# Patient Record
Sex: Female | Born: 1995
Health system: Southern US, Community
[De-identification: ages and names within clinical notes are randomized; demographics above are authoritative.]

## PROBLEM LIST (undated history)

## (undated) DIAGNOSIS — K219 Gastro-esophageal reflux disease without esophagitis: Secondary | ICD-10-CM

## (undated) DIAGNOSIS — F419 Anxiety disorder, unspecified: Secondary | ICD-10-CM

## (undated) DIAGNOSIS — T7840XA Allergy, unspecified, initial encounter: Secondary | ICD-10-CM

## (undated) DIAGNOSIS — F32A Depression, unspecified: Secondary | ICD-10-CM

## (undated) HISTORY — DX: Gastro-esophageal reflux disease without esophagitis: K21.9

## (undated) HISTORY — DX: Allergy, unspecified, initial encounter: T78.40XA

## (undated) HISTORY — DX: Anxiety disorder, unspecified: F41.9

## (undated) HISTORY — DX: Depression, unspecified: F32.A

---
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
## 2017-01-18 HISTORY — PX: WISDOM TOOTH EXTRACTION: SHX21

## 2019-10-27 DIAGNOSIS — R102 Pelvic and perineal pain: Secondary | ICD-10-CM | POA: Diagnosis not present

## 2019-10-27 DIAGNOSIS — Z202 Contact with and (suspected) exposure to infections with a predominantly sexual mode of transmission: Secondary | ICD-10-CM | POA: Diagnosis not present

## 2019-10-30 DIAGNOSIS — Z23 Encounter for immunization: Secondary | ICD-10-CM | POA: Diagnosis not present

## 2019-10-30 DIAGNOSIS — Z124 Encounter for screening for malignant neoplasm of cervix: Secondary | ICD-10-CM | POA: Diagnosis not present

## 2019-10-30 DIAGNOSIS — R102 Pelvic and perineal pain: Secondary | ICD-10-CM | POA: Diagnosis not present

## 2019-10-31 DIAGNOSIS — R102 Pelvic and perineal pain: Secondary | ICD-10-CM | POA: Diagnosis not present

## 2019-10-31 LAB — HM PAP SMEAR: HM Pap smear: NEGATIVE

## 2020-01-19 DIAGNOSIS — J019 Acute sinusitis, unspecified: Secondary | ICD-10-CM | POA: Diagnosis not present

## 2020-01-21 ENCOUNTER — Ambulatory Visit: Payer: Self-pay | Admitting: Physician Assistant

## 2020-03-04 NOTE — Progress Notes (Signed)
New patient visit   Patient: Bethany Malone   DOB: [birth year]   24 y.o. Female  MRN: [id number] Visit Date: 03/05/2020  Today's healthcare provider: Trey Sailors, PA-C   Chief Complaint  Patient presents with  . New Patient (Initial Visit)  I,Bethany Malone M Bethany Malone,acting as a scribe for Trey Sailors, PA-C.,have documented all relevant documentation on the behalf of Trey Sailors, PA-C,as directed by  Trey Sailors, PA-C while in the presence of Trey Sailors, PA-C.  Subjective    Bethany Malone is a 25 y.o. female who presents today as a new patient to establish care.  HPI   Originally from [place], [place]. Living in [place], [place] with mom and step dad. Currently in school at [employer] in [place] for Medical Ultrasound. Just started this. 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   She has a history of anxiety and depression, previously treated with zoloft and Wellbutrin. She is not currently on any medications. She has a history of going to counseling, no longer pursuing this. She does not want to restart medications.   Reports some episodes of palpitations, SOB and chest pain that have been bothering her for several months. It is on the left side of her chest and can be an intermittent stabbing pain. She does not experience this with activity and does not go away with rest. She smoked from 18 to 21, a pack every 3- 4 days. At 21 transitioned to the vaping, after which felt palpitations. Reports a history of running out of breath since being a young child. She does have a history of palpitations that happen twice per week but have been happening more frequently over the past month or so, typically at night. She reports anxiety associated with these episodes. Caffeine use: previously drank [place] energy drinks but does not drink it any longer. She does smoke weed one every two days most commonly a night. Drinks coffee once every three days. Does vape flavored products. She was concerned  at one point that these pains might represent a blood clot or COPD. Patient reports significant anxiety surrounding these symptoms and overall concern that something serious is wrong with her, especially concerning her vaping use.   Abdominal pain.  History reviewed. No pertinent past medical history. History reviewed. No pertinent surgical history. Family Status  Relation Name Status  . Mother  Alive   Family History  Problem Relation Age of Onset  . Anxiety disorder Mother    Social History   Socioeconomic History  . Marital status: Single    Spouse name: Not on file  . Number of children: Not on file  . Years of education: Not on file  . Highest education level: Not on file  Occupational History  . Not on file  Tobacco Use  . Smoking status: Never Smoker  . Smokeless tobacco: Never Used  Vaping Use  . Vaping Use: Every day  Substance and Sexual Activity  . Alcohol use: Yes  . Drug use: Yes    Types: Marijuana  . Sexual activity: Not on file  Other Topics Concern  . Not on file  Social History Narrative  . Not on file   Social Determinants of Health   Financial Resource Strain: Not on file  Food Insecurity: Not on file  Transportation Needs: Not on file  Physical Activity: Not on file  Stress: Not on file  Social Connections: Not on file   No outpatient medications prior to visit.  No facility-administered medications prior to visit.   Allergies  Allergen Reactions  . Fluticasone Other (See Comments)    Headaches    Immunization History  Administered Date(s) Administered  . Influenza,inj,Quad PF,6+ Mos 11/04/2014    Health Maintenance  Topic Date Due  . Hepatitis C Screening  Never done  . HIV Screening  Never done  . INFLUENZA VACCINE  04/17/2020 (Originally 08/19/2019)  . TETANUS/TDAP  03/05/2021 (Originally 12/11/2016)  . PAP-Cervical Cytology Screening  10/31/2022  . PAP SMEAR-Modifier  10/31/2022    Patient Care Team: Maryella Shivers as PCP - General (Physician Assistant)  Review of Systems    Objective    BP 110/77 (BP Location: Left Arm, Patient Position: Sitting, Cuff Size: Normal)   Pulse 83   Temp 98.3 F (36.8 C) (Oral)   Ht 5\' 6"  (1.676 m)   Wt 139 lb 1.6 oz (63.1 kg)   SpO2 100%   BMI 22.45 kg/m  Physical Exam Constitutional:      Appearance: Normal appearance.  HENT:     Right Ear: Tympanic membrane, ear canal and external ear normal.     Left Ear: Tympanic membrane, ear canal and external ear normal.  Cardiovascular:     Rate and Rhythm: Normal rate and regular rhythm.     Pulses: Normal pulses.     Heart sounds: Normal heart sounds.  Pulmonary:     Effort: Pulmonary effort is normal.     Breath sounds: Normal breath sounds.  Abdominal:     General: Abdomen is flat. Bowel sounds are normal.     Palpations: Abdomen is soft.  Skin:    General: Skin is warm and dry.  Neurological:     General: No focal deficit present.     Mental Status: She is alert and oriented to person, place, and time.  Psychiatric:        Mood and Affect: Mood normal.        Behavior: Behavior normal.      Depression Screen No flowsheet data found. Results for orders placed or performed in visit on 03/05/20  HM PAP SMEAR  Result Value Ref Range   HM Pap smear NEGATIVE FOR INTRAEPITHELIAL LESION OR MALIGNANCY     Assessment & Plan     1. Palpitations  No red flags, offered EKG or referral to cardiology, patient declines. Suspect these may be due to untreated anxiety. Offered to get labs, but patient declines.   2. Chest pain, unspecified type  Suspect MSK etiology.   3. Vaping nicotine dependence, non-tobacco product  Counseled smoking cessation.   4. Anxiety  Not currently on treatment, does not wish to pursue any.   5. Moderate asthma, unspecified whether complicated, unspecified whether persistent  Sounds like she may hae  - albuterol (VENTOLIN HFA) 108 (90 Base) MCG/ACT inhaler; Inhale 2  puffs into the lungs every 6 (six) hours as needed for wheezing or shortness of breath.  Dispense: 1 each; Refill: 2  6. Abdominal pain, unspecified abdominal location   7. Need for prophylactic vaccination with combined diphtheria-tetanus-pertussis (DTP) vaccine  Declined.    No follow-ups on file.     I02/18/22, PA-C, have reviewed all documentation for this visit. The documentation on 03/13/20 for the exam, diagnosis, procedures, and orders are all accurate and complete.  The entirety of the information documented in the History of Present Illness, Review of Systems and Physical Exam were personally obtained by me. Portions of this  information were initially documented by [facility] and reviewed by me for thoroughness and accuracy.   I spent 30 minutes dedicated to the care of this patient on the date of this encounter to include pre-visit review of records, face-to-face time with the patient discussing palpitations, and post visit ordering of testing.    Maryella Shivers  [facility] [phone] (phone) [phone] (fax)  [facility]

## 2020-03-05 ENCOUNTER — Encounter: Payer: Self-pay | Admitting: Physician Assistant

## 2020-03-05 ENCOUNTER — Ambulatory Visit (INDEPENDENT_AMBULATORY_CARE_PROVIDER_SITE_OTHER): Payer: BC Managed Care – PPO | Admitting: Physician Assistant

## 2020-03-05 ENCOUNTER — Other Ambulatory Visit: Payer: Self-pay

## 2020-03-05 VITALS — BP 110/77 | HR 83 | Temp 98.3°F | Ht 66.0 in | Wt 139.1 lb

## 2020-03-05 DIAGNOSIS — J45909 Unspecified asthma, uncomplicated: Secondary | ICD-10-CM

## 2020-03-05 DIAGNOSIS — R109 Unspecified abdominal pain: Secondary | ICD-10-CM

## 2020-03-05 DIAGNOSIS — F419 Anxiety disorder, unspecified: Secondary | ICD-10-CM | POA: Diagnosis not present

## 2020-03-05 DIAGNOSIS — F17200 Nicotine dependence, unspecified, uncomplicated: Secondary | ICD-10-CM | POA: Diagnosis not present

## 2020-03-05 DIAGNOSIS — R079 Chest pain, unspecified: Secondary | ICD-10-CM | POA: Diagnosis not present

## 2020-03-05 DIAGNOSIS — Z23 Encounter for immunization: Secondary | ICD-10-CM

## 2020-03-05 DIAGNOSIS — R002 Palpitations: Secondary | ICD-10-CM

## 2020-03-05 MED ORDER — ALBUTEROL SULFATE HFA 108 (90 BASE) MCG/ACT IN AERS: 2.0000 | INHALATION_SPRAY | Freq: Four times a day (QID) | RESPIRATORY_TRACT | 2 refills | Status: DC | PRN

## 2020-03-13 ENCOUNTER — Encounter: Payer: Self-pay | Admitting: Physician Assistant

## 2020-05-21 ENCOUNTER — Encounter: Payer: Self-pay | Admitting: Family Medicine

## 2020-05-21 ENCOUNTER — Other Ambulatory Visit: Payer: Self-pay

## 2020-05-21 ENCOUNTER — Ambulatory Visit (INDEPENDENT_AMBULATORY_CARE_PROVIDER_SITE_OTHER): Payer: BC Managed Care – PPO | Admitting: Family Medicine

## 2020-05-21 VITALS — BP 118/77 | HR 96 | Temp 98.2°F | Ht 66.5 in | Wt 146.0 lb

## 2020-05-21 DIAGNOSIS — K219 Gastro-esophageal reflux disease without esophagitis: Secondary | ICD-10-CM | POA: Diagnosis not present

## 2020-05-21 DIAGNOSIS — Z13220 Encounter for screening for lipoid disorders: Secondary | ICD-10-CM | POA: Diagnosis not present

## 2020-05-21 DIAGNOSIS — R42 Dizziness and giddiness: Secondary | ICD-10-CM | POA: Diagnosis not present

## 2020-05-21 DIAGNOSIS — R0602 Shortness of breath: Secondary | ICD-10-CM | POA: Diagnosis not present

## 2020-05-21 DIAGNOSIS — R1012 Left upper quadrant pain: Secondary | ICD-10-CM

## 2020-05-21 LAB — URINALYSIS, ROUTINE W REFLEX MICROSCOPIC
Bilirubin, UA: NEGATIVE
Glucose, UA: NEGATIVE
Ketones, UA: NEGATIVE
Leukocytes,UA: NEGATIVE
Nitrite, UA: NEGATIVE
Protein,UA: NEGATIVE
RBC, UA: NEGATIVE
Specific Gravity, UA: 1.025 (ref 1.005–1.030)
Urobilinogen, Ur: 1 mg/dL (ref 0.2–1.0)
pH, UA: 6 (ref 5.0–7.5)

## 2020-05-21 MED ORDER — OMEPRAZOLE 20 MG PO CPDR: 20.0000 mg | DELAYED_RELEASE_CAPSULE | Freq: Every day | ORAL | 3 refills | Status: DC

## 2020-05-21 NOTE — Patient Instructions (Addendum)
.The Mindfulness App.  .Headspace.  .Calm.  Marland KitchenMINDBODY.  .Buddhify.  .Insight Timer.  .Smiling Mind.  .Meditation Timer Pro.  Michaell Cowing    Gastritis, Adult Gastritis is inflammation of the stomach. There are two kinds of gastritis:  Acute gastritis. This kind develops suddenly.  Chronic gastritis. This kind is much more common and lasts for a long time. Gastritis happens when the lining of the stomach becomes weak or gets damaged. Without treatment, gastritis can lead to stomach bleeding and ulcers. What are the causes? This condition may be caused by:  An infection.  Drinking too much alcohol.  Certain medicines. These include steroids, antibiotics, and some over-the-counter medicines, such as aspirin or ibuprofen.  Having too much acid in the stomach.  A disease of the intestines or stomach.  Stress.  An allergic reaction.  Crohn's disease.  Some cancer treatments (radiation). Sometimes the cause of this condition is not known. What are the signs or symptoms? Symptoms of this condition include:  Pain or a burning sensation in the upper abdomen.  Nausea.  Vomiting.  An uncomfortable feeling of fullness after eating.  Weight loss.  Bad breath.  Blood in your vomit or stools. In some cases, there are no symptoms. How is this diagnosed? This condition may be diagnosed with:  Your medical history and a description of your symptoms.  A physical exam.  Tests. These can include: ? Blood tests. ? Stool tests. ? A test in which a thin, flexible instrument with a light and a camera is passed down the esophagus and into the stomach (upper endoscopy). ? A test in which a sample of tissue is taken for testing (biopsy). How is this treated? This condition may be treated with medicines. The medicines that are used vary depending on the cause of the gastritis:  If the condition is caused by a bacterial infection, you may be given antibiotic medicines.  If the  condition is caused by too much acid in the stomach, you may be given medicines called H2 blockers, proton pump inhibitors, or antacids. Treatment may also involve stopping the use of certain medicines, such as aspirin, ibuprofen, or other NSAIDs. Follow these instructions at home: Medicines  Take over-the-counter and prescription medicines only as told by your health care provider.  If you were prescribed an antibiotic medicine, take it as told by your health care provider. Do not stop taking the antibiotic even if you start to feel better. Eating and drinking  Eat small, frequent meals instead of large meals.  Avoid foods and drinks that make your symptoms worse.  Drink enough fluid to keep your urine pale yellow.   Alcohol use  Do not drink alcohol if: ? Your health care provider tells you not to drink. ? You are pregnant, may be pregnant, or are planning to become pregnant.  If you drink alcohol: ? Limit your use to:  0-1 drink a day for women.  0-2 drinks a day for men. ? Be aware of how much alcohol is in your drink. In the [place] one drink equals one 12 oz bottle of beer (355 mL), one 5 oz glass of wine (148 mL), or one 1 oz glass of hard liquor (44 mL). General instructions  Talk with your health care provider about ways to manage stress, such as getting regular exercise or practicing deep breathing, meditation, or yoga.  Do not use any products that contain nicotine or tobacco, such as cigarettes and e-cigarettes. If you need help quitting, ask  your health care provider.  Keep all follow-up visits as told by your health care provider. This is important. Contact a health care provider if:  Your symptoms get worse.  Your symptoms return after treatment. Get help right away if:  You vomit blood or material that looks like coffee grounds.  You have black or dark red stools.  You are unable to keep fluids down.  Your abdominal pain gets worse.  You have a  fever.  You do not feel better after one week. Summary  Gastritis is inflammation of the lining of the stomach that can occur suddenly (acute) or develop slowly over time (chronic).  This condition is diagnosed with a medical history, a physical exam, or tests.  This condition may be treated with medicines to treat infection or medicines to reduce the amount of acid in your stomach.  Follow your health care provider's instructions about taking medicines, making changes to your diet, and knowing when to call for help. This information is not intended to replace advice given to you by your health care provider. Make sure you discuss any questions you have with your health care provider. Document Revised: 05/24/2017 Document Reviewed: 05/24/2017 Elsevier Patient Education  2021 [employer].

## 2020-05-21 NOTE — Progress Notes (Signed)
BP 118/77 (Patient Position: Standing)   Pulse 96   Temp 98.2 F (36.8 C)   Ht 5' 6.5" (1.689 m)   Wt 146 lb (66.2 kg)   SpO2 98%   BMI 23.21 kg/m    Subjective:    Patient ID: Bethany Malone, female    DOB: 01/28/1995, 25 y.o.   MRN: [id number]  HPI: Bethany Malone is a 25 y.o. female who presents today to establish care  Chief Complaint  Patient presents with  . Establish Care  . Abdominal Pain    Patient states for about 6 months a few times a week she gets a pain in her left side. Has woken her up during the night twice.   . Shortness of Breath    Patient states she gets short of breath sometimes. Happens especially when she exercises and sometimes when she is just sitting there. Was prescribed inhaler but never picked up.   . Dizziness    Patient states she sometimes gets dizzy. She gets a feeling that the room is spinning and like she is going to fall. Patient states it is not every day, gets dizzy maybe once or twice a month.    ABDOMINAL PAIN- has been having some LUQ abdominal pain for about 6 months. Has had 2 intense pains at night, the last about 2 months ago that lasted several hours  Duration: 6 months Onset: gradual Severity: severe Quality: sharp pain occasionally with usually dull pain Location:  LUQ  Radiation: occasionally into her side Frequency: intermittent every 2-3 days Alleviating factors: nothing Aggravating factors: mashing on it Status: fluctuating Treatments attempted: none Fever: no Nausea: no Vomiting: no Weight loss: no Decreased appetite: no Diarrhea: no Constipation: no Blood in stool: no Heartburn: yes Jaundice: no Rash: no Dysuria/urinary frequency: no Hematuria: no History of sexually transmitted disease: no Recurrent NSAID use: no   DIZZINESS- started feeling dizzy last summer. Had been dehydrated. Passed out last year x1  Duration: almost a year Description of symptoms: room spinning Duration of episode:  seconds Dizziness frequency: 1x every 2 weeks Provoking factors: none Aggravating factors:  none Triggered by rolling over in bed: no Triggered by bending over: no Aggravated by head movement: no Aggravated by exertion, coughing, loud noises: no Recent head injury: no Recent or current viral symptoms: no History of vasovagal episodes: yes Nausea: no Vomiting: no Tinnitus: yes Hearing loss: no Aural fullness: no Headache: yes Photophobia/phonophobia: no Unsteady gait: no Postural instability: no Diplopia, dysarthria, dysphagia or weakness: no Related to exertion: no Pallor: no Diaphoresis: no Dyspnea: yes Chest pain: no  SHORTNESS OF BREATH- had been smoking vapes, has been smoking cigarettes since high school Duration: about 2 years- after smoking vapes for about 6 months- seems to have gotten better since she stopped vaping Onset: sudden Description of breathing discomfort: couldn't take a deep breath Severity: moderate Episode duration: seconds Frequency: every few weeks/when she smokes vapes Related to exertion: no Cough: no Chest tightness: yes Wheezing: no Fevers: no Chest pain: no Palpitations: no  Nausea: no Diaphoresis: no Deconditioning: no Status: better   Active Ambulatory Problems    Diagnosis Date Noted  . No Active Ambulatory Problems   Resolved Ambulatory Problems    Diagnosis Date Noted  . No Resolved Ambulatory Problems   No Additional Past Medical History   Past Surgical History:  Procedure Laterality Date  . PLANTAR FASCIA SURGERY Right 2014   Outpatient Encounter Medications as of 05/21/2020  Medication Sig  .  omeprazole (PRILOSEC) 20 MG capsule Take 1 capsule (20 mg total) by mouth daily.  Marland Kitchen albuterol (VENTOLIN HFA) 108 (90 Base) MCG/ACT inhaler Inhale 2 puffs into the lungs every 6 (six) hours as needed for wheezing or shortness of breath. (Patient not taking: Reported on 05/21/2020)   No facility-administered encounter medications  on file as of 05/21/2020.   Allergies  Allergen Reactions  . Fluticasone Other (See Comments)    Headaches   Social History   Socioeconomic History  . Marital status: Single    Spouse name: Not on file  . Number of children: Not on file  . Years of education: Not on file  . Highest education level: Not on file  Occupational History  . Not on file  Tobacco Use  . Smoking status: Current Every Day Smoker    Types: Cigarettes  . Smokeless tobacco: Never Used  . Tobacco comment: 5 cigarettes a day   Vaping Use  . Vaping Use: Former  Substance and Sexual Activity  . Alcohol use: Yes  . Drug use: Yes    Types: Marijuana  . Sexual activity: Yes  Other Topics Concern  . Not on file  Social History Narrative  . Not on file   Social Determinants of Health   Financial Resource Strain: Not on file  Food Insecurity: Not on file  Transportation Needs: Not on file  Physical Activity: Not on file  Stress: Not on file  Social Connections: Not on file   Family History  Problem Relation Age of Onset  . Anxiety disorder Mother   . Rheum arthritis Mother   . Fibromyalgia Mother   . Anxiety disorder Sister   . Cancer Maternal Grandfather   . Cancer Paternal Grandmother   . Stroke Paternal Grandfather   . Cancer Paternal Grandfather     Review of Systems  Constitutional: Negative.   Respiratory: Positive for shortness of breath. Negative for apnea, cough, choking, chest tightness, wheezing and stridor.   Cardiovascular: Negative.   Gastrointestinal: Positive for abdominal pain. Negative for abdominal distention, anal bleeding, blood in stool, constipation, diarrhea, nausea, rectal pain and vomiting.  Genitourinary: Negative.   Musculoskeletal: Negative.   Skin: Negative.   Psychiatric/Behavioral: Negative.        Having intense dreams    Per HPI unless specifically indicated above     Objective:    BP 118/77 (Patient Position: Standing)   Pulse 96   Temp 98.2 F (36.8  C)   Ht 5' 6.5" (1.689 m)   Wt 146 lb (66.2 kg)   SpO2 98%   BMI 23.21 kg/m   Wt Readings from Last 3 Encounters:  05/21/20 146 lb (66.2 kg)  03/05/20 139 lb 1.6 oz (63.1 kg)    Physical Exam Vitals and nursing note reviewed.  Constitutional:      General: She is not in acute distress.    Appearance: Normal appearance. She is not ill-appearing, toxic-appearing or diaphoretic.  HENT:     Head: Normocephalic and atraumatic.     Right Ear: External ear normal.     Left Ear: External ear normal.     Nose: Nose normal.     Mouth/Throat:     Mouth: Mucous membranes are moist.     Pharynx: Oropharynx is clear.  Eyes:     General: No scleral icterus.       Right eye: No discharge.        Left eye: No discharge.     Extraocular Movements:  Extraocular movements intact.     Conjunctiva/sclera: Conjunctivae normal.     Pupils: Pupils are equal, round, and reactive to light.  Cardiovascular:     Rate and Rhythm: Normal rate and regular rhythm.     Pulses: Normal pulses.     Heart sounds: Normal heart sounds. No murmur heard. No friction rub. No gallop.   Pulmonary:     Effort: Pulmonary effort is normal. No respiratory distress.     Breath sounds: Normal breath sounds. No stridor. No wheezing, rhonchi or rales.  Chest:     Chest wall: No tenderness.  Abdominal:     General: Abdomen is flat. Bowel sounds are normal.     Palpations: Abdomen is soft.     Tenderness: There is abdominal tenderness in the left upper quadrant.  Musculoskeletal:        General: Normal range of motion.     Cervical back: Normal range of motion and neck supple.  Skin:    General: Skin is warm and dry.     Capillary Refill: Capillary refill takes less than 2 seconds.     Coloration: Skin is not jaundiced or pale.     Findings: No bruising, erythema, lesion or rash.  Neurological:     General: No focal deficit present.     Mental Status: She is alert and oriented to person, place, and time. Mental  status is at baseline.  Psychiatric:        Mood and Affect: Mood normal.        Behavior: Behavior normal.        Thought Content: Thought content normal.        Judgment: Judgment normal.     Results for orders placed or performed in visit on 03/05/20  HM PAP SMEAR  Result Value Ref Range   HM Pap smear NEGATIVE FOR INTRAEPITHELIAL LESION OR MALIGNANCY       Assessment & Plan:   Problem List Items Addressed This Visit   None   Visit Diagnoses    LUQ abdominal pain    -  Primary   Concerns for GERD- will start omeprazole and check labs and CXR. Recheck 1 month. Call with any concerns.    Relevant Orders   H. pylori antigen, stool   CBC with Differential/Platelet   Comprehensive metabolic panel   Urinalysis, Routine w reflex microscopic   Dizziness       ?Due to dehydration- will check labs. Await results. Contiue to monitor.    Relevant Orders   CBC with Differential/Platelet   Comprehensive metabolic panel   TSH   VITAMIN D 25 Hydroxy (Vit-D Deficiency, Fractures)   Gastroesophageal reflux disease, unspecified whether esophagitis present       Will check on h pylori and start omeprazole. Call with any concerns.    Relevant Medications   omeprazole (PRILOSEC) 20 MG capsule   Other Relevant Orders   CBC with Differential/Platelet   Comprehensive metabolic panel   SOB (shortness of breath)       Will check CXR- likely due to vaping in that it's improved. Call with any concerns. Await results.    Relevant Orders   DG Chest 2 View   Screening for cholesterol level       Labs drawn today. Await results.    Relevant Orders   Lipid Panel w/o Chol/HDL Ratio       Follow up plan: Return in about 4 weeks (around 06/18/2020).

## 2020-05-22 ENCOUNTER — Other Ambulatory Visit: Payer: Self-pay | Admitting: Family Medicine

## 2020-05-22 DIAGNOSIS — R748 Abnormal levels of other serum enzymes: Secondary | ICD-10-CM

## 2020-05-22 LAB — CBC WITH DIFFERENTIAL/PLATELET
Basophils Absolute: 0.2 10*3/uL (ref 0.0–0.2)
Basos: 2 %
EOS (ABSOLUTE): 0 10*3/uL (ref 0.0–0.4)
Eos: 0 %
Hematocrit: 42.2 % (ref 34.0–46.6)
Hemoglobin: 14.2 g/dL (ref 11.1–15.9)
Immature Grans (Abs): 0 10*3/uL (ref 0.0–0.1)
Immature Granulocytes: 1 %
Lymphocytes Absolute: 6 10*3/uL — ABNORMAL HIGH (ref 0.7–3.1)
Lymphs: 69 %
MCH: 29 pg (ref 26.6–33.0)
MCHC: 33.6 g/dL (ref 31.5–35.7)
MCV: 86 fL (ref 79–97)
Monocytes Absolute: 0.6 10*3/uL (ref 0.1–0.9)
Monocytes: 7 %
Neutrophils Absolute: 1.8 10*3/uL (ref 1.4–7.0)
Neutrophils: 21 %
Platelets: 220 10*3/uL (ref 150–450)
RBC: 4.9 x10E6/uL (ref 3.77–5.28)
RDW: 12.7 % (ref 11.7–15.4)
WBC: 8.7 10*3/uL (ref 3.4–10.8)

## 2020-05-22 LAB — COMPREHENSIVE METABOLIC PANEL
ALT: 320 IU/L — ABNORMAL HIGH (ref 0–32)
AST: 208 IU/L — ABNORMAL HIGH (ref 0–40)
Albumin/Globulin Ratio: 1.4 (ref 1.2–2.2)
Albumin: 4.7 g/dL (ref 3.9–5.0)
Alkaline Phosphatase: 190 IU/L — ABNORMAL HIGH (ref 44–121)
BUN/Creatinine Ratio: 13 (ref 9–23)
BUN: 11 mg/dL (ref 6–20)
Bilirubin Total: 0.4 mg/dL (ref 0.0–1.2)
CO2: 20 mmol/L (ref 20–29)
Calcium: 9.8 mg/dL (ref 8.7–10.2)
Chloride: 103 mmol/L (ref 96–106)
Creatinine, Ser: 0.82 mg/dL (ref 0.57–1.00)
Globulin, Total: 3.3 g/dL (ref 1.5–4.5)
Glucose: 76 mg/dL (ref 65–99)
Potassium: 4.6 mmol/L (ref 3.5–5.2)
Sodium: 140 mmol/L (ref 134–144)
Total Protein: 8 g/dL (ref 6.0–8.5)
eGFR: 104 mL/min/{1.73_m2} (ref >59)

## 2020-05-22 LAB — LIPID PANEL W/O CHOL/HDL RATIO
Cholesterol, Total: 151 mg/dL (ref 100–199)
HDL: 57 mg/dL (ref >39)
LDL Chol Calc (NIH): 82 mg/dL (ref 0–99)
Triglycerides: 57 mg/dL (ref 0–149)
VLDL Cholesterol Cal: 12 mg/dL (ref 5–40)

## 2020-05-22 LAB — TSH: TSH: 1.72 u[IU]/mL (ref 0.450–4.500)

## 2020-05-22 LAB — VITAMIN D 25 HYDROXY (VIT D DEFICIENCY, FRACTURES): Vit D, 25-Hydroxy: 32.6 ng/mL (ref 30.0–100.0)

## 2020-05-26 ENCOUNTER — Other Ambulatory Visit: Payer: BC Managed Care – PPO

## 2020-05-26 ENCOUNTER — Ambulatory Visit
Admission: RE | Admit: 2020-05-26 | Discharge: 2020-05-26 | Disposition: A | Discharge status: Home / Self Care | Payer: BC Managed Care – PPO | Source: Home / Self Care | Attending: Family Medicine | Admitting: Family Medicine

## 2020-05-26 ENCOUNTER — Ambulatory Visit
Admission: RE | Admit: 2020-05-26 | Discharge: 2020-05-26 | Disposition: A | Discharge status: Home / Self Care | Payer: BC Managed Care – PPO | Source: Ambulatory Visit | Attending: Family Medicine | Admitting: Family Medicine

## 2020-05-26 ENCOUNTER — Other Ambulatory Visit: Payer: Self-pay

## 2020-05-26 DIAGNOSIS — R1012 Left upper quadrant pain: Secondary | ICD-10-CM | POA: Diagnosis not present

## 2020-05-26 DIAGNOSIS — R0602 Shortness of breath: Secondary | ICD-10-CM | POA: Diagnosis not present

## 2020-05-28 LAB — H. PYLORI ANTIGEN, STOOL: H pylori Ag, Stl: NEGATIVE

## 2020-06-02 ENCOUNTER — Other Ambulatory Visit: Payer: Self-pay | Admitting: Family Medicine

## 2020-06-02 ENCOUNTER — Encounter: Payer: Self-pay | Admitting: Family Medicine

## 2020-06-02 ENCOUNTER — Other Ambulatory Visit: Payer: Self-pay

## 2020-06-02 ENCOUNTER — Other Ambulatory Visit: Payer: BC Managed Care – PPO

## 2020-06-02 DIAGNOSIS — R748 Abnormal levels of other serum enzymes: Secondary | ICD-10-CM | POA: Diagnosis not present

## 2020-06-03 LAB — ACUTE VIRAL HEPATITIS (HAV, HBV, HCV)
HCV Ab: <0.1 s/co ratio (ref 0.0–0.9)
Hep A IgM: NEGATIVE
Hep B C IgM: NEGATIVE
Hepatitis B Surface Ag: NEGATIVE

## 2020-06-03 LAB — HCV INTERPRETATION

## 2020-06-04 ENCOUNTER — Other Ambulatory Visit: Payer: Self-pay

## 2020-06-04 ENCOUNTER — Ambulatory Visit
Admission: RE | Admit: 2020-06-04 | Discharge: 2020-06-04 | Disposition: A | Discharge status: Home / Self Care | Payer: BC Managed Care – PPO | Source: Ambulatory Visit | Attending: Family Medicine | Admitting: Family Medicine

## 2020-06-04 DIAGNOSIS — R748 Abnormal levels of other serum enzymes: Secondary | ICD-10-CM | POA: Insufficient documentation

## 2020-06-04 DIAGNOSIS — R7989 Other specified abnormal findings of blood chemistry: Secondary | ICD-10-CM | POA: Diagnosis not present

## 2020-06-05 LAB — PROTEIN ELECTROPHORESIS, SERUM
A/G Ratio: 1 (ref 0.7–1.7)
Albumin ELP: 4.1 g/dL (ref 2.9–4.4)
Alpha 1: 0.3 g/dL (ref 0.0–0.4)
Alpha 2: 0.7 g/dL (ref 0.4–1.0)
Beta: 1.2 g/dL (ref 0.7–1.3)
Gamma Globulin: 1.9 g/dL — ABNORMAL HIGH (ref 0.4–1.8)
Globulin, Total: 4 g/dL — ABNORMAL HIGH (ref 2.2–3.9)

## 2020-06-05 LAB — COMPREHENSIVE METABOLIC PANEL
ALT: 136 IU/L — ABNORMAL HIGH (ref 0–32)
AST: 56 IU/L — ABNORMAL HIGH (ref 0–40)
Albumin/Globulin Ratio: 1.5 (ref 1.2–2.2)
Albumin: 4.9 g/dL (ref 3.9–5.0)
Alkaline Phosphatase: 128 IU/L — ABNORMAL HIGH (ref 44–121)
BUN/Creatinine Ratio: 21 (ref 9–23)
BUN: 16 mg/dL (ref 6–20)
Bilirubin Total: 0.5 mg/dL (ref 0.0–1.2)
CO2: 20 mmol/L (ref 20–29)
Calcium: 9.7 mg/dL (ref 8.7–10.2)
Chloride: 101 mmol/L (ref 96–106)
Creatinine, Ser: 0.75 mg/dL (ref 0.57–1.00)
Globulin, Total: 3.2 g/dL (ref 1.5–4.5)
Glucose: 80 mg/dL (ref 65–99)
Potassium: 4.3 mmol/L (ref 3.5–5.2)
Sodium: 138 mmol/L (ref 134–144)
Total Protein: 8.1 g/dL (ref 6.0–8.5)
eGFR: 115 mL/min/{1.73_m2} (ref >59)

## 2020-06-05 LAB — FERRITIN: Ferritin: 44 ng/mL (ref 15–150)

## 2020-06-05 LAB — IRON AND TIBC
Iron Saturation: 33 % (ref 15–55)
Iron: 150 ug/dL (ref 27–159)
Total Iron Binding Capacity: 459 ug/dL — ABNORMAL HIGH (ref 250–450)
UIBC: 309 ug/dL (ref 131–425)

## 2020-06-05 LAB — GAMMA GT: GGT: 146 IU/L — ABNORMAL HIGH (ref 0–60)

## 2020-06-05 LAB — MITOCHONDRIAL ANTIBODIES: Mitochondrial Ab: <20 Units (ref 0.0–20.0)

## 2020-06-05 LAB — CERULOPLASMIN: Ceruloplasmin: 30.8 mg/dL (ref 19.0–39.0)

## 2020-06-05 LAB — HIV ANTIBODY (ROUTINE TESTING W REFLEX): HIV Screen 4th Generation wRfx: NONREACTIVE

## 2020-06-05 LAB — HEPATITIS B SURFACE ANTIBODY, QUANTITATIVE: Hepatitis B Surf Ab Quant: 4.6 m[IU]/mL — ABNORMAL LOW (ref >9.9)

## 2020-06-26 ENCOUNTER — Encounter: Payer: Self-pay | Admitting: Family Medicine

## 2020-06-26 ENCOUNTER — Ambulatory Visit (INDEPENDENT_AMBULATORY_CARE_PROVIDER_SITE_OTHER): Payer: BC Managed Care – PPO | Admitting: Family Medicine

## 2020-06-26 ENCOUNTER — Other Ambulatory Visit: Payer: Self-pay

## 2020-06-26 VITALS — BP 118/67 | HR 81 | Temp 98.2°F | Wt 151.2 lb

## 2020-06-26 DIAGNOSIS — Z23 Encounter for immunization: Secondary | ICD-10-CM | POA: Diagnosis not present

## 2020-06-26 DIAGNOSIS — R748 Abnormal levels of other serum enzymes: Secondary | ICD-10-CM | POA: Diagnosis not present

## 2020-06-26 DIAGNOSIS — R1012 Left upper quadrant pain: Secondary | ICD-10-CM

## 2020-06-26 NOTE — Progress Notes (Signed)
BP 118/67   Pulse 81   Temp 98.2 F (36.8 C)   Wt 151 lb 3.2 oz (68.6 kg)   SpO2 100%   BMI 24.04 kg/m    Subjective:    Patient ID: Bethany Malone, female    DOB: Apr 06, 1995, 25 y.o.   MRN: [id number]  HPI: Bethany Malone is a 25 y.o. female  Chief Complaint  Patient presents with   Gastroesophageal Reflux   GERD GERD control status: better Satisfied with current treatment? yes Heartburn frequency: occasionally- much less often Medication side effects: no  Medication compliance: excellent Dysphagia: no Odynophagia:  no Hematemesis: no Blood in stool: no EGD: no   Relevant past medical, surgical, family and social history reviewed and updated as indicated. Interim medical history since our last visit reviewed. Allergies and medications reviewed and updated.  Review of Systems  Constitutional: Negative.   Respiratory: Negative.    Cardiovascular: Negative.   Gastrointestinal:  Positive for abdominal pain. Negative for abdominal distention, anal bleeding, blood in stool, constipation, diarrhea, nausea, rectal pain and vomiting.  Musculoskeletal: Negative.   Skin: Negative.   Psychiatric/Behavioral: Negative.     Per HPI unless specifically indicated above     Objective:    BP 118/67   Pulse 81   Temp 98.2 F (36.8 C)   Wt 151 lb 3.2 oz (68.6 kg)   SpO2 100%   BMI 24.04 kg/m   Wt Readings from Last 3 Encounters:  06/26/20 151 lb 3.2 oz (68.6 kg)  05/21/20 146 lb (66.2 kg)  03/05/20 139 lb 1.6 oz (63.1 kg)    Physical Exam Vitals and nursing note reviewed.  Constitutional:      General: She is not in acute distress.    Appearance: Normal appearance. She is not ill-appearing, toxic-appearing or diaphoretic.  HENT:     Head: Normocephalic and atraumatic.     Right Ear: External ear normal.     Left Ear: External ear normal.     Nose: Nose normal.     Mouth/Throat:     Mouth: Mucous membranes are moist.     Pharynx: Oropharynx is clear.  Eyes:      General: No scleral icterus.       Right eye: No discharge.        Left eye: No discharge.     Extraocular Movements: Extraocular movements intact.     Conjunctiva/sclera: Conjunctivae normal.     Pupils: Pupils are equal, round, and reactive to light.  Cardiovascular:     Rate and Rhythm: Normal rate and regular rhythm.     Pulses: Normal pulses.     Heart sounds: Normal heart sounds. No murmur heard.   No friction rub. No gallop.  Pulmonary:     Effort: Pulmonary effort is normal. No respiratory distress.     Breath sounds: Normal breath sounds. No stridor. No wheezing, rhonchi or rales.  Chest:     Chest wall: No tenderness.  Musculoskeletal:        General: Normal range of motion.     Cervical back: Normal range of motion and neck supple.  Skin:    General: Skin is warm and dry.     Capillary Refill: Capillary refill takes less than 2 seconds.     Coloration: Skin is not jaundiced or pale.     Findings: No bruising, erythema, lesion or rash.  Neurological:     General: No focal deficit present.     Mental Status: She is  alert and oriented to person, place, and time. Mental status is at baseline.  Psychiatric:        Mood and Affect: Mood normal.        Behavior: Behavior normal.        Thought Content: Thought content normal.        Judgment: Judgment normal.    Results for orders placed or performed in visit on 06/02/20  Acute Viral Hepatitis (HAV, HBV, HCV)  Result Value Ref Range   Hep A IgM Negative Negative   Hepatitis B Surface Ag Negative Negative   Hep B C IgM Negative Negative   HCV Ab <0.1 0.0 - 0.9 s/co ratio  Interpretation:  Result Value Ref Range   HCV Interp 1: Comment       Assessment & Plan:   Problem List Items Addressed This Visit   None Visit Diagnoses     LUQ abdominal pain    -  Primary   Significantly improved on the omeprazole- will take for about 3 months and work on decreasing frequency. Call with any concerns or if getting  worse.    Elevated liver enzymes       Normal work up so far, hopefully just a virus. Will recheck labs today. Await results.    Relevant Orders   Comprehensive metabolic panel   Need for hepatitis B vaccination       Hep B given today.   Relevant Orders   Hepatitis B vaccine adult IM (Completed)        Follow up plan: Return for October physcial with me, 1 month nurse visit hep B#2.

## 2020-06-27 LAB — COMPREHENSIVE METABOLIC PANEL
ALT: 23 IU/L (ref 0–32)
AST: 24 IU/L (ref 0–40)
Albumin/Globulin Ratio: 1.4 (ref 1.2–2.2)
Albumin: 4.5 g/dL (ref 3.9–5.0)
Alkaline Phosphatase: 77 IU/L (ref 44–121)
BUN/Creatinine Ratio: 17 (ref 9–23)
BUN: 14 mg/dL (ref 6–20)
Bilirubin Total: 0.4 mg/dL (ref 0.0–1.2)
CO2: 20 mmol/L (ref 20–29)
Calcium: 9.4 mg/dL (ref 8.7–10.2)
Chloride: 103 mmol/L (ref 96–106)
Creatinine, Ser: 0.83 mg/dL (ref 0.57–1.00)
Globulin, Total: 3.3 g/dL (ref 1.5–4.5)
Glucose: 68 mg/dL (ref 65–99)
Potassium: 4.2 mmol/L (ref 3.5–5.2)
Sodium: 139 mmol/L (ref 134–144)
Total Protein: 7.8 g/dL (ref 6.0–8.5)
eGFR: 102 mL/min/{1.73_m2} (ref >59)

## 2020-07-01 ENCOUNTER — Ambulatory Visit: Admission: RE | Admit: 2020-07-01 | Payer: BC Managed Care – PPO | Source: Ambulatory Visit

## 2020-07-25 ENCOUNTER — Ambulatory Visit: Payer: BC Managed Care – PPO

## 2020-09-07 DIAGNOSIS — M25461 Effusion, right knee: Secondary | ICD-10-CM | POA: Diagnosis not present

## 2020-09-07 DIAGNOSIS — W1849XA Other slipping, tripping and stumbling without falling, initial encounter: Secondary | ICD-10-CM | POA: Diagnosis not present

## 2020-09-07 DIAGNOSIS — S8001XA Contusion of right knee, initial encounter: Secondary | ICD-10-CM | POA: Diagnosis not present

## 2020-09-19 ENCOUNTER — Other Ambulatory Visit: Payer: Self-pay

## 2020-09-19 ENCOUNTER — Ambulatory Visit (INDEPENDENT_AMBULATORY_CARE_PROVIDER_SITE_OTHER): Payer: BC Managed Care – PPO | Admitting: Family Medicine

## 2020-09-19 ENCOUNTER — Encounter: Payer: Self-pay | Admitting: Family Medicine

## 2020-09-19 VITALS — BP 114/78 | HR 88 | Temp 99.4°F | Ht 66.5 in | Wt 154.2 lb

## 2020-09-19 DIAGNOSIS — T148XXA Other injury of unspecified body region, initial encounter: Secondary | ICD-10-CM

## 2020-09-19 DIAGNOSIS — H6011 Cellulitis of right external ear: Secondary | ICD-10-CM

## 2020-09-19 DIAGNOSIS — Z23 Encounter for immunization: Secondary | ICD-10-CM

## 2020-09-19 MED ORDER — SULFAMETHOXAZOLE-TRIMETHOPRIM 800-160 MG PO TABS: 1.0000 | ORAL_TABLET | Freq: Two times a day (BID) | ORAL | 0 refills | Status: DC

## 2020-09-19 NOTE — Progress Notes (Signed)
BP 114/78   Pulse 88   Temp 99.4 F (37.4 C) (Oral)   Ht 5' 6.5" (1.689 m)   Wt 154 lb 3.2 oz (69.9 kg)   SpO2 99%   BMI 24.52 kg/m    Subjective:    Patient ID: Bethany Malone, female    DOB: 01-Feb-1995, 25 y.o.   MRN: [id number]  HPI: Bethany Malone is a 25 y.o. female  Chief Complaint  Patient presents with   Abrasion    On left thigh since Sunday, scratched on a rusty nail. Last Td vaccination was 07/31/2010   piercing    Has piercing on right ear seems to be getting infected, was done in March 2022.    SKIN INFECTION Duration: about 2 months Location: tragus infection with new piercing History of trauma in area: yes Pain: yes Quality: aching and sore Severity: moderateH3 Redness: yes Swelling: yes Oozing: no Pus: no Fevers: no Nausea/vomiting: no Status: better, worse, stable, and fluctuating Treatments attempted: hydrogen peroxide and warm compresses  Tetanus: UTD   Relevant past medical, surgical, family and social history reviewed and updated as indicated. Interim medical history since our last visit reviewed. Allergies and medications reviewed and updated.  Review of Systems  Constitutional: Negative.   Respiratory: Negative.    Cardiovascular: Negative.   Skin:  Positive for wound. Negative for color change, pallor and rash.  Psychiatric/Behavioral: Negative.     Per HPI unless specifically indicated above     Objective:    BP 114/78   Pulse 88   Temp 99.4 F (37.4 C) (Oral)   Ht 5' 6.5" (1.689 m)   Wt 154 lb 3.2 oz (69.9 kg)   SpO2 99%   BMI 24.52 kg/m   Wt Readings from Last 3 Encounters:  09/19/20 154 lb 3.2 oz (69.9 kg)  06/26/20 151 lb 3.2 oz (68.6 kg)  05/21/20 146 lb (66.2 kg)    Physical Exam Vitals and nursing note reviewed.  Constitutional:      General: She is not in acute distress.    Appearance: Normal appearance. She is not ill-appearing, toxic-appearing or diaphoretic.  HENT:     Head: Normocephalic and  atraumatic.      Right Ear: External ear normal.     Left Ear: External ear normal.     Nose: Nose normal.     Mouth/Throat:     Mouth: Mucous membranes are moist.     Pharynx: Oropharynx is clear.  Eyes:     General: No scleral icterus.       Right eye: No discharge.        Left eye: No discharge.     Extraocular Movements: Extraocular movements intact.     Conjunctiva/sclera: Conjunctivae normal.     Pupils: Pupils are equal, round, and reactive to light.  Cardiovascular:     Rate and Rhythm: Normal rate and regular rhythm.     Pulses: Normal pulses.     Heart sounds: Normal heart sounds. No murmur heard.   No friction rub. No gallop.  Pulmonary:     Effort: Pulmonary effort is normal. No respiratory distress.     Breath sounds: Normal breath sounds. No stridor. No wheezing, rhonchi or rales.  Chest:     Chest wall: No tenderness.  Musculoskeletal:        General: Normal range of motion.     Cervical back: Normal range of motion and neck supple.  Skin:    General: Skin is warm and  dry.     Capillary Refill: Capillary refill takes less than 2 seconds.     Coloration: Skin is not jaundiced or pale.     Findings: No bruising, erythema, lesion or rash.  Neurological:     General: No focal deficit present.     Mental Status: She is alert and oriented to person, place, and time. Mental status is at baseline.  Psychiatric:        Mood and Affect: Mood normal.        Behavior: Behavior normal.        Thought Content: Thought content normal.        Judgment: Judgment normal.    Results for orders placed or performed in visit on 06/26/20  Comprehensive metabolic panel  Result Value Ref Range   Glucose 68 65 - 99 mg/dL   BUN 14 6 - 20 mg/dL   Creatinine, Ser 0.83 0.57 - 1.00 mg/dL   eGFR 102 >59 mL/min/1.73   BUN/Creatinine Ratio 17 9 - 23   Sodium 139 134 - 144 mmol/L   Potassium 4.2 3.5 - 5.2 mmol/L   Chloride 103 96 - 106 mmol/L   CO2 20 20 - 29 mmol/L   Calcium 9.4  8.7 - 10.2 mg/dL   Total Protein 7.8 6.0 - 8.5 g/dL   Albumin 4.5 3.9 - 5.0 g/dL   Globulin, Total 3.3 1.5 - 4.5 g/dL   Albumin/Globulin Ratio 1.4 1.2 - 2.2   Bilirubin Total 0.4 0.0 - 1.2 mg/dL   Alkaline Phosphatase 77 44 - 121 IU/L   AST 24 0 - 40 IU/L   ALT 23 0 - 32 IU/L      Assessment & Plan:   Problem List Items Addressed This Visit   None Visit Diagnoses     Cellulitis of right ear    -  Primary   Will treat with bactrim. Call if not getting better. Continue to monitor.    Abrasion       Healing well. Tdap updated today.        Follow up plan: Return if symptoms worsen or fail to improve.

## 2020-09-24 ENCOUNTER — Telehealth: Payer: Self-pay

## 2020-09-24 NOTE — Telephone Encounter (Signed)
Copied from CRM [id number]. Topic: General - Other >> Sep 24, 2020  9:08 AM Jaquita Rector A wrote: Reason for CRM: Patient called in to inform Dr Laural Benes that the issue that she was seen for have gotten worst and she stated that the bump have gotten bigger and it hurts and she is not sure how to treat this. Asking for a call back please  Ph# [phone]   Routing to provider to advise patient.

## 2020-09-26 NOTE — Telephone Encounter (Signed)
Patient states she has taken out the piercing and using peroxide and states it seems to be getting better. Patient thinks she may getting ear infection as the side of her head hurts now. Patient states she is going to keep an eye out on it over the weekend and states she will give our office a call back Monday. Advised patient if it is getting worse to go to her local urgent care. Patient verbalized understanding.

## 2020-09-26 NOTE — Telephone Encounter (Signed)
It may be a keloid scar- I would advise her to take out the piercing if it's still not getting better.

## 2020-10-31 ENCOUNTER — Encounter: Payer: BC Managed Care – PPO | Admitting: Family Medicine

## 2021-02-26 ENCOUNTER — Encounter: Payer: Self-pay | Admitting: Family Medicine

## 2021-02-26 ENCOUNTER — Other Ambulatory Visit: Payer: Self-pay

## 2021-02-26 ENCOUNTER — Ambulatory Visit (INDEPENDENT_AMBULATORY_CARE_PROVIDER_SITE_OTHER): Payer: 59 | Admitting: Family Medicine

## 2021-02-26 VITALS — BP 125/66 | HR 93 | Temp 98.8°F | Wt 157.0 lb

## 2021-02-26 DIAGNOSIS — J029 Acute pharyngitis, unspecified: Secondary | ICD-10-CM

## 2021-02-26 MED ORDER — AMOXICILLIN 500 MG PO TABS: 500.0000 mg | ORAL_TABLET | Freq: Two times a day (BID) | ORAL | 0 refills | Status: DC

## 2021-02-26 NOTE — Progress Notes (Signed)
BP 125/66    Pulse 93    Temp 98.8 F (37.1 C) (Oral)    Wt 157 lb (71.2 kg)    SpO2 98%    BMI 24.96 kg/m    Subjective:    Patient ID: Bethany Malone, female    DOB: 10/27/1995, 26 y.o.   MRN: [id number]  HPI: Bethany Malone is a 26 y.o. female  Chief Complaint  Patient presents with   Sore Throat    Pt states she started having a sore throat for the last few days. States her sinuses are also irritated. States her throat is mainly hurting on the R side.    UPPER RESPIRATORY TRACT INFECTION Duration: about 2 days Worst symptom: sore throat mainly on R side Fever: yes Cough: no Shortness of breath: no Wheezing: no Chest pain: no Chest tightness: no Chest congestion: no Nasal congestion: no Runny nose: no Post nasal drip: yes Sneezing: no Sore throat: yes Swollen glands: yes Sinus pressure: yes Headache: yes Face pain: no Toothache: no Ear pain: no  Ear pressure: no  Eyes red/itching:no Eye drainage/crusting: no  Vomiting: no Rash: no Fatigue: yes Sick contacts: yes Strep contacts: no  Context: worse Recurrent sinusitis: no Relief with OTC cold/cough medications: no  Treatments attempted: tylenol   Relevant past medical, surgical, family and social history reviewed and updated as indicated. Interim medical history since our last visit reviewed. Allergies and medications reviewed and updated.  Review of Systems  Constitutional:  Positive for chills, diaphoresis, fatigue and fever. Negative for activity change, appetite change and unexpected weight change.  HENT:  Positive for sore throat. Negative for congestion, dental problem, drooling, ear discharge, ear pain, facial swelling, hearing loss, mouth sores, nosebleeds, postnasal drip, rhinorrhea, sinus pressure, sinus pain, sneezing, tinnitus, trouble swallowing and voice change.   Eyes: Negative.   Respiratory: Negative.    Cardiovascular: Negative.   Gastrointestinal: Negative.   Musculoskeletal:  Negative.   Psychiatric/Behavioral: Negative.     Per HPI unless specifically indicated above     Objective:    BP 125/66    Pulse 93    Temp 98.8 F (37.1 C) (Oral)    Wt 157 lb (71.2 kg)    SpO2 98%    BMI 24.96 kg/m   Wt Readings from Last 3 Encounters:  02/26/21 157 lb (71.2 kg)  09/19/20 154 lb 3.2 oz (69.9 kg)  06/26/20 151 lb 3.2 oz (68.6 kg)    Physical Exam Vitals and nursing note reviewed.  Constitutional:      General: She is not in acute distress.    Appearance: Normal appearance. She is not ill-appearing, toxic-appearing or diaphoretic.  HENT:     Head: Normocephalic and atraumatic.     Right Ear: External ear normal.     Left Ear: External ear normal.     Nose: Nose normal.     Mouth/Throat:     Mouth: Mucous membranes are moist.     Pharynx: Oropharynx is clear.     Tonsils: Tonsillar exudate present. 2+ on the right. 1+ on the left.  Eyes:     General: No scleral icterus.       Right eye: No discharge.        Left eye: No discharge.     Extraocular Movements: Extraocular movements intact.     Conjunctiva/sclera: Conjunctivae normal.     Pupils: Pupils are equal, round, and reactive to light.  Cardiovascular:     Rate and Rhythm: Normal rate  and regular rhythm.     Pulses: Normal pulses.     Heart sounds: Normal heart sounds. No murmur heard.   No friction rub. No gallop.  Pulmonary:     Effort: Pulmonary effort is normal. No respiratory distress.     Breath sounds: Normal breath sounds. No stridor. No wheezing, rhonchi or rales.  Chest:     Chest wall: No tenderness.  Musculoskeletal:        General: Normal range of motion.     Cervical back: Normal range of motion and neck supple.  Skin:    General: Skin is warm and dry.     Capillary Refill: Capillary refill takes less than 2 seconds.     Coloration: Skin is not jaundiced or pale.     Findings: No bruising, erythema, lesion or rash.  Neurological:     General: No focal deficit present.      Mental Status: She is alert and oriented to person, place, and time. Mental status is at baseline.  Psychiatric:        Mood and Affect: Mood normal.        Behavior: Behavior normal.        Thought Content: Thought content normal.        Judgment: Judgment normal.    Results for orders placed or performed in visit on 06/26/20  Comprehensive metabolic panel  Result Value Ref Range   Glucose 68 65 - 99 mg/dL   BUN 14 6 - 20 mg/dL   Creatinine, Ser 0.83 0.57 - 1.00 mg/dL   eGFR 102 >59 mL/min/1.73   BUN/Creatinine Ratio 17 9 - 23   Sodium 139 134 - 144 mmol/L   Potassium 4.2 3.5 - 5.2 mmol/L   Chloride 103 96 - 106 mmol/L   CO2 20 20 - 29 mmol/L   Calcium 9.4 8.7 - 10.2 mg/dL   Total Protein 7.8 6.0 - 8.5 g/dL   Albumin 4.5 3.9 - 5.0 g/dL   Globulin, Total 3.3 1.5 - 4.5 g/dL   Albumin/Globulin Ratio 1.4 1.2 - 2.2   Bilirubin Total 0.4 0.0 - 1.2 mg/dL   Alkaline Phosphatase 77 44 - 121 IU/L   AST 24 0 - 40 IU/L   ALT 23 0 - 32 IU/L      Assessment & Plan:   Problem List Items Addressed This Visit   None Visit Diagnoses     Sore throat    -  Primary   Significant exudate. Will treat with amoxicillin and she will gargle with warm salt water. Continue to monitor. Call with any concerns.    Relevant Orders   Veritor Flu A/B Waived   Rapid Strep Screen ([facility] ONLY)   Novel Coronavirus, NAA ([facility])        Follow up plan: Return if symptoms worsen or fail to improve.

## 2021-02-27 LAB — NOVEL CORONAVIRUS, NAA: SARS-CoV-2, NAA: NOT DETECTED

## 2021-03-02 LAB — CULTURE, GROUP A STREP

## 2021-03-02 LAB — VERITOR FLU A/B WAIVED
Influenza A: NEGATIVE
Influenza B: NEGATIVE

## 2021-03-02 LAB — RAPID STREP SCREEN (MED CTR MEBANE ONLY): Strep Gp A Ag, IA W/Reflex: NEGATIVE

## 2021-03-13 ENCOUNTER — Ambulatory Visit: Payer: Self-pay | Admitting: *Deleted

## 2021-03-13 ENCOUNTER — Encounter: Payer: Self-pay | Admitting: Family Medicine

## 2021-03-13 NOTE — Telephone Encounter (Signed)
°  Chief Complaint: Strep throat coming back.   Diagnosed and treated for strep on Feb. 9th.  Better and getting sick again. Symptoms: Same area of throat getting sore again, feeling bad Frequency: Started last night feeling bad again after feeling fine after taking the antibiotic. Pertinent Negatives: Patient denies N/A Disposition: [] ED /[] Urgent Care (no appt availability in office) / [] Appointment(In office/virtual)/ []  [facility]/ [] Home Care/ [] Refused Recommended Disposition /[] [facility]/ [x]  Follow-up with PCP Additional Notes: Pt requesting Dr. call in another round of antibiotics so she won't have to go through the weekend with strep throat.   She's sending a MyChart message also.

## 2021-03-13 NOTE — Telephone Encounter (Signed)
Reason for Disposition  [1] Exposure to family member (or spouse or boyfriend/girlfriend) with test-proven strep AND [2] within last 10 days    Pt diagnosed and treated for strep throat on Feb. 9th   Got better but now the sore throat and symptoms are returning.  Answer Assessment - Initial Assessment Questions 1. ONSET: "When did the throat start hurting?" (Hours or days ago)      Treated for strep throat but I feel like it's coming back.    Feb 9th was treated for it.    I had one spot in my throat that was bad.   I felt fine after finishing the antibiotics.    Last night I started feeling sick again.   Usually Zyrtec helps but it didn't help.     I know it's Friday.   Will she call me in another round of antibiotics?    I don't want to have strep throat all weekend.  I let her know I would send a high priority note now.  2. SEVERITY: "How bad is the sore throat?" (Scale 1-10; mild, moderate or severe)   - MILD (1-3):  doesn't interfere with eating or normal activities   - MODERATE (4-7): interferes with eating some solids and normal activities   - SEVERE (8-10):  excruciating pain, interferes with most normal activities   - SEVERE DYSPHAGIA: can't swallow liquids, drooling     It's coming back the strep throat symptoms. 3. STREP EXPOSURE: "Has there been any exposure to strep within the past week?" If Yes, ask: "What type of contact occurred?"      I was diagnosed with it and treated for it but the symptoms are returning. 4.  VIRAL SYMPTOMS: "Are there any symptoms of a cold, such as a runny nose, cough, hoarse voice or red eyes?"      *No Answer* 5. FEVER: "Do you have a fever?" If Yes, ask: "What is your temperature, how was it measured, and when did it start?"     *No Answer* 6. PUS ON THE TONSILS: "Is there pus on the tonsils in the back of your throat?"     *No Answer* 7. OTHER SYMPTOMS: "Do you have any other symptoms?" (e.g., difficulty breathing, headache, rash)     *No  Answer* 8. PREGNANCY: "Is there any chance you are pregnant?" "When was your last menstrual period?"     *No Answer*  Protocols used: Sore Throat-A-AH

## 2021-03-13 NOTE — Telephone Encounter (Signed)
Routing to provider to advise.  

## 2021-03-16 ENCOUNTER — Ambulatory Visit (INDEPENDENT_AMBULATORY_CARE_PROVIDER_SITE_OTHER): Payer: 59 | Admitting: Nurse Practitioner

## 2021-03-16 ENCOUNTER — Encounter: Payer: Self-pay | Admitting: Nurse Practitioner

## 2021-03-16 ENCOUNTER — Other Ambulatory Visit: Payer: Self-pay

## 2021-03-16 VITALS — BP 99/70 | HR 75 | Temp 98.6°F | Wt 155.0 lb

## 2021-03-16 DIAGNOSIS — N3000 Acute cystitis without hematuria: Secondary | ICD-10-CM | POA: Diagnosis not present

## 2021-03-16 DIAGNOSIS — R309 Painful micturition, unspecified: Secondary | ICD-10-CM

## 2021-03-16 DIAGNOSIS — B9689 Other specified bacterial agents as the cause of diseases classified elsewhere: Secondary | ICD-10-CM | POA: Diagnosis not present

## 2021-03-16 DIAGNOSIS — N760 Acute vaginitis: Secondary | ICD-10-CM | POA: Diagnosis not present

## 2021-03-16 LAB — URINALYSIS, ROUTINE W REFLEX MICROSCOPIC
Bilirubin, UA: NEGATIVE
Glucose, UA: NEGATIVE
Ketones, UA: NEGATIVE
Nitrite, UA: NEGATIVE
Protein,UA: NEGATIVE
RBC, UA: NEGATIVE
Specific Gravity, UA: 1.025 (ref 1.005–1.030)
Urobilinogen, Ur: 0.2 mg/dL (ref 0.2–1.0)
pH, UA: 6 (ref 5.0–7.5)

## 2021-03-16 LAB — WET PREP FOR TRICH, YEAST, CLUE
Clue Cell Exam: POSITIVE — AB
Trichomonas Exam: NEGATIVE
Yeast Exam: NEGATIVE

## 2021-03-16 LAB — MICROSCOPIC EXAMINATION: RBC, Urine: NONE SEEN /hpf (ref 0–2)

## 2021-03-16 MED ORDER — METRONIDAZOLE 500 MG PO TABS
500.0000 mg | ORAL_TABLET | Freq: Two times a day (BID) | ORAL | 0 refills | Status: AC
Start: 2021-03-16 — End: 2021-03-23

## 2021-03-16 MED ORDER — NITROFURANTOIN MONOHYD MACRO 100 MG PO CAPS: 100.0000 mg | ORAL_CAPSULE | Freq: Two times a day (BID) | ORAL | 0 refills | Status: DC

## 2021-03-16 NOTE — Progress Notes (Signed)
BP 99/70    Pulse 75    Temp 98.6 F (37 C) (Oral)    Wt 155 lb (70.3 kg)    LMP 03/13/2021 (Exact Date)    SpO2 98%    BMI 24.65 kg/m    Subjective:    Patient ID: Bethany Malone, female    DOB: May 14, 1995, 26 y.o.   MRN: [id number]  HPI: Bethany Malone is a 26 y.o. female  Chief Complaint  Patient presents with   Urinary Tract Infection    Pt states she has been having pain and burning with urination. States symptoms started last night.    Sore Throat    Pt states she was treated with strep a few weeks ago, completed her antibiotic but still feels a little soreness in her throat    URINARY SYMPTOMS Dysuria: yes Urinary frequency: no Urgency: no Small volume voids: no Symptom severity: no Urinary incontinence: no Foul odor: yes Hematuria: no Abdominal pain: no Back pain: no Suprapubic pain/pressure: yes Flank pain: no Fever:  no Vomiting: no Symptoms started last night   Relevant past medical, surgical, family and social history reviewed and updated as indicated. Interim medical history since our last visit reviewed. Allergies and medications reviewed and updated.  Review of Systems  Constitutional:  Negative for fever.  Gastrointestinal:  Negative for abdominal pain and vomiting.  Genitourinary:  Positive for dysuria and frequency. Negative for decreased urine volume, flank pain, hematuria and urgency.  Musculoskeletal:  Negative for back pain.   Per HPI unless specifically indicated above     Objective:    BP 99/70    Pulse 75    Temp 98.6 F (37 C) (Oral)    Wt 155 lb (70.3 kg)    LMP 03/13/2021 (Exact Date)    SpO2 98%    BMI 24.65 kg/m   Wt Readings from Last 3 Encounters:  03/16/21 155 lb (70.3 kg)  02/26/21 157 lb (71.2 kg)  09/19/20 154 lb 3.2 oz (69.9 kg)    Physical Exam Vitals and nursing note reviewed.  Constitutional:      General: She is not in acute distress.    Appearance: Normal appearance. She is normal weight. She is not  ill-appearing, toxic-appearing or diaphoretic.  HENT:     Head: Normocephalic.     Right Ear: External ear normal.     Left Ear: External ear normal.     Nose: Nose normal.     Mouth/Throat:     Mouth: Mucous membranes are moist.     Pharynx: Oropharynx is clear.  Eyes:     General:        Right eye: No discharge.        Left eye: No discharge.     Extraocular Movements: Extraocular movements intact.     Conjunctiva/sclera: Conjunctivae normal.     Pupils: Pupils are equal, round, and reactive to light.  Cardiovascular:     Rate and Rhythm: Normal rate and regular rhythm.     Heart sounds: No murmur heard. Pulmonary:     Effort: Pulmonary effort is normal. No respiratory distress.     Breath sounds: Normal breath sounds. No wheezing or rales.  Abdominal:     General: Abdomen is flat. Bowel sounds are normal. There is no distension.     Palpations: Abdomen is soft.     Tenderness: There is abdominal tenderness. There is no right CVA tenderness, left CVA tenderness or guarding.  Musculoskeletal:  Cervical back: Normal range of motion and neck supple.  Skin:    General: Skin is warm and dry.     Capillary Refill: Capillary refill takes less than 2 seconds.  Neurological:     General: No focal deficit present.     Mental Status: She is alert and oriented to person, place, and time. Mental status is at baseline.  Psychiatric:        Mood and Affect: Mood normal.        Behavior: Behavior normal.        Thought Content: Thought content normal.        Judgment: Judgment normal.    Results for orders placed or performed in visit on 02/26/21  Rapid Strep Screen ([facility] ONLY)   Specimen: Other   Other  Result Value Ref Range   Strep Gp A Ag, IA W/Reflex Negative Negative  Novel Coronavirus, NAA ([facility])   Specimen: Saline  Result Value Ref Range   SARS-CoV-2, NAA Not Detected Not Detected  Culture, Group A Strep   Other  Result Value Ref Range   Strep A Culture  Comment (A)   Veritor Flu A/B Waived  Result Value Ref Range   Influenza A Negative Negative   Influenza B Negative Negative      Assessment & Plan:   Problem List Items Addressed This Visit   None Visit Diagnoses     Acute cystitis without hematuria    -  Primary   Complete course of macrobid. Will send for culture. Return to clinic if symptoms worsen or fail to improve.    Relevant Orders   Urine Culture   Bacterial vaginosis       Will treat with Flagyl. Discussed not drinking alcohol.  Follow up if symptoms worsen or fail tp improve.   Relevant Medications   nitrofurantoin, macrocrystal-monohydrate, (MACROBID) 100 MG capsule   metroNIDAZOLE (FLAGYL) 500 MG tablet   Pain with urination       Relevant Orders   Urinalysis, Routine w reflex microscopic   WET PREP FOR TRICH, YEAST, CLUE        Follow up plan: Return if symptoms worsen or fail to improve.

## 2021-03-16 NOTE — Telephone Encounter (Signed)
Pt being seen today @ 4pm with Clydie Braun

## 2021-03-17 ENCOUNTER — Encounter: Payer: Self-pay | Admitting: Nurse Practitioner

## 2021-03-17 DIAGNOSIS — Z975 Presence of (intrauterine) contraceptive device: Secondary | ICD-10-CM

## 2021-03-17 NOTE — Progress Notes (Signed)
Results discussed with patient during visit.

## 2021-03-19 LAB — URINE CULTURE

## 2021-03-19 NOTE — Progress Notes (Signed)
Hi Keyle.  You are on the right antibiotic for the bacteria that grew.  Please let me know if you have any questions.

## 2021-03-24 ENCOUNTER — Ambulatory Visit (INDEPENDENT_AMBULATORY_CARE_PROVIDER_SITE_OTHER): Payer: 59 | Admitting: Advanced Practice Midwife

## 2021-03-24 ENCOUNTER — Other Ambulatory Visit: Payer: Self-pay

## 2021-03-24 ENCOUNTER — Encounter: Payer: Self-pay | Admitting: Advanced Practice Midwife

## 2021-03-24 VITALS — BP 120/70 | Ht 66.0 in | Wt 155.0 lb

## 2021-03-24 DIAGNOSIS — Z3009 Encounter for other general counseling and advice on contraception: Secondary | ICD-10-CM | POA: Diagnosis not present

## 2021-03-25 ENCOUNTER — Encounter: Payer: Self-pay | Admitting: Advanced Practice Midwife

## 2021-03-25 NOTE — Progress Notes (Signed)
? ?Patient ID: Bethany Malone, female   DOB: 1995-11-01, 25 y.o.   MRN: [id number] ? ?Reason for Consult: Consult ? ? ?Subjective:  ?Date of Service: 03/24/2021 ? ?HPI: ? ?Bethany Malone is a 26 y.o. female being seen for birth control consult. She is specifically interested in an IUD and accepts information regarding various forms of birth control. She has a concern for weight gain as she is self conscious about the same. She reports having gained weight on birth control pill. She uses nicotine in the form of Vaping. We discussed the risks/benefits generally of the various forms of birth control and specifically of the hormonal and non-hormonal IUD. We also discussed the importance of continued healthy lifestyle with respect to weight gain/vaping cessation. She prefers [place] and will call for insertion appointment when her next period starts. ? ?History reviewed. No pertinent past medical history. ?Family History  ?Problem Relation Age of Onset  ? Anxiety disorder Mother   ? Rheum arthritis Mother   ? Fibromyalgia Mother   ? Anxiety disorder Sister   ? Cancer Maternal Grandfather   ? Cancer Paternal Grandmother   ? Stroke Paternal Grandfather   ? Cancer Paternal Grandfather   ? ?Past Surgical History:  ?Procedure Laterality Date  ? PLANTAR FASCIA SURGERY Right 2014  ? ? ?Short Social History:  ?Social History  ? ?Tobacco Use  ? Smoking status: Former  ?  Types: Cigarettes  ? Smokeless tobacco: Never  ? Tobacco comments:  ?  5 cigarettes a day   ?Substance Use Topics  ? Alcohol use: Yes  ?  Comment: on occasion  ? ? ?Allergies  ?Allergen Reactions  ? Fluticasone Other (See Comments)  ?  Headaches  ? ? ?Current Outpatient Medications  ?Medication Sig Dispense Refill  ? nitrofurantoin, macrocrystal-monohydrate, (MACROBID) 100 MG capsule Take 1 capsule (100 mg total) by mouth 2 (two) times daily. (Patient not taking: Reported on 03/24/2021) 10 capsule 0  ? ?No current facility-administered medications for this visit.   ? ? ?Review of Systems  ?Constitutional:  Negative for chills and fever.  ?HENT:  Negative for congestion, ear discharge, ear pain, hearing loss, sinus pain and sore throat.   ?Eyes:  Negative for blurred vision and double vision.  ?Respiratory:  Negative for cough, shortness of breath and wheezing.   ?Cardiovascular:  Negative for chest pain, palpitations and leg swelling.  ?Gastrointestinal:  Negative for abdominal pain, blood in stool, constipation, diarrhea, heartburn, melena, nausea and vomiting.  ?Genitourinary:  Negative for dysuria, flank pain, frequency, hematuria and urgency.  ?Musculoskeletal:  Negative for back pain, joint pain and myalgias.  ?Skin:  Negative for itching and rash.  ?Neurological:  Negative for dizziness, tingling, tremors, sensory change, speech change, focal weakness, seizures, loss of consciousness, weakness and headaches.  ?Endo/Heme/Allergies:  Negative for environmental allergies. Does not bruise/bleed easily.  ?Psychiatric/Behavioral:  Negative for depression, hallucinations, memory loss, substance abuse and suicidal ideas. The patient is not nervous/anxious and does not have insomnia.   ? ? ?   ?Objective:  ?Objective  ? ?Vitals:  ? 03/24/21 1417  ?BP: 120/70  ?Weight: 155 lb (70.3 kg)  ?Height: 5\' 6"  (1.676 m)  ? ?Body mass index is 25.02 kg/m?Marland Kitchen ?Constitutional: Well nourished, well developed female in no acute distress.  ?HEENT: normal ?Skin: Warm and dry.  ?Respiratory:  Normal respiratory effort ?Neuro: DTRs 2+, Cranial nerves grossly intact ?Psych: Alert and Oriented x3. No memory deficits. Normal mood and affect.  ? ? ?Time spent with  patient primarily in consultation: 18 minutes ?Assessment/Plan:  ?  ? ?26 y.o. G0 P0 birth control consult ? ?Call when next period starts for IUD insertion- [place] ? ? ?Rod Can CNM ?[facility] ?[facility] ?03/25/2021, 8:44 AM ? ? ?

## 2021-04-08 ENCOUNTER — Ambulatory Visit (INDEPENDENT_AMBULATORY_CARE_PROVIDER_SITE_OTHER): Payer: 59 | Admitting: Internal Medicine

## 2021-04-08 ENCOUNTER — Encounter: Payer: Self-pay | Admitting: Internal Medicine

## 2021-04-08 ENCOUNTER — Other Ambulatory Visit: Payer: Self-pay

## 2021-04-08 VITALS — BP 114/74 | HR 87 | Temp 98.5°F | Ht 65.98 in | Wt 155.6 lb

## 2021-04-08 DIAGNOSIS — N760 Acute vaginitis: Secondary | ICD-10-CM | POA: Diagnosis not present

## 2021-04-08 DIAGNOSIS — A64 Unspecified sexually transmitted disease: Secondary | ICD-10-CM | POA: Diagnosis not present

## 2021-04-08 DIAGNOSIS — B9689 Other specified bacterial agents as the cause of diseases classified elsewhere: Secondary | ICD-10-CM | POA: Diagnosis not present

## 2021-04-08 DIAGNOSIS — Z1159 Encounter for screening for other viral diseases: Secondary | ICD-10-CM | POA: Diagnosis not present

## 2021-04-08 MED ORDER — FEXOFENADINE HCL 180 MG PO TABS: 180.0000 mg | ORAL_TABLET | Freq: Every day | ORAL | 1 refills | Status: DC

## 2021-04-08 MED ORDER — METRONIDAZOLE 500 MG PO TABS: 500.0000 mg | ORAL_TABLET | Freq: Two times a day (BID) | ORAL | 0 refills | Status: AC

## 2021-04-08 NOTE — Progress Notes (Signed)
? ?BP 114/74   Pulse 87   Temp 98.5 ?F (36.9 ?C) (Oral)   Ht 5' 5.98" (1.676 m)   Wt 155 lb 9.6 oz (70.6 kg)   LMP 03/13/2021 (Exact Date)   SpO2 100%   BMI 25.13 kg/m?   ? ?Subjective:  ? ? Patient ID: Bethany Malone, female    DOB: 17-Nov-1995, 26 y.o.   MRN: [id number] ? ?Chief Complaint  ?Patient presents with  ?? vaginal burning  ?  Started this morning. Burning has subsided as the day went on.Would like STD testing ?  ?? head congestion  ?  Started yesterday ?  ? ? ?HPI: ?Bethany Malone is a 26 y.o. female ? ?Has had vaginal burning and painful , not worse when she pees got over a uti a couple weeks ago. No abdominal pain this time. Burned outside and inside the vaginal area. No discharge.  ?Same partner 2020 last  ? ? ? ?Chief Complaint  ?Patient presents with  ?? vaginal burning  ?  Started this morning. Burning has subsided as the day went on.Would like STD testing ?  ?? head congestion  ?  Started yesterday ?  ? ? ?Relevant past medical, surgical, family and social history reviewed and updated as indicated. Interim medical history since our last visit reviewed. ?Allergies and medications reviewed and updated. ? ?Review of Systems ? ?Per HPI unless specifically indicated above ? ?   ?Objective:  ?  ?BP 114/74   Pulse 87   Temp 98.5 ?F (36.9 ?C) (Oral)   Ht 5' 5.98" (1.676 m)   Wt 155 lb 9.6 oz (70.6 kg)   LMP 03/13/2021 (Exact Date)   SpO2 100%   BMI 25.13 kg/m?   ?Wt Readings from Last 3 Encounters:  ?04/08/21 155 lb 9.6 oz (70.6 kg)  ?03/24/21 155 lb (70.3 kg)  ?03/16/21 155 lb (70.3 kg)  ?  ?Physical Exam ? ?Results for orders placed or performed in visit on 03/16/21  ?Urine Culture  ? Specimen: Urine  ? UR  ?Result Value Ref Range  ? Urine Culture, Routine Final report (A)   ? Organism ID, Bacteria Escherichia coli (A)   ? Antimicrobial Susceptibility Comment   ?Microscopic Examination  ?Result Value Ref Range  ? WBC, UA 6-10 (A) 0 - 5 /hpf  ? RBC None seen 0 - 2 /hpf  ? Epithelial Cells  (non renal) 0-10 0 - 10 /hpf  ? Bacteria, UA Few (A) None seen/Few  ?WET PREP FOR [place], YEAST, CLUE  ? Urine  ?Result Value Ref Range  ? Trichomonas Exam Negative Negative  ? Yeast Exam Negative Negative  ? Clue Cell Exam Positive (A) Negative  ?Urinalysis, Routine w reflex microscopic  ?Result Value Ref Range  ? Specific Gravity, UA 1.025 1.005 - 1.030  ? pH, UA 6.0 5.0 - 7.5  ? Color, UA Yellow Yellow  ? Appearance Ur Cloudy (A) Clear  ? Leukocytes,UA 1+ (A) Negative  ? Protein,UA Negative Negative/Trace  ? Glucose, UA Negative Negative  ? Ketones, UA Negative Negative  ? RBC, UA Negative Negative  ? Bilirubin, UA Negative Negative  ? Urobilinogen, Ur 0.2 0.2 - 1.0 mg/dL  ? Nitrite, UA Negative Negative  ? Microscopic Examination See below:   ? ?   ? ? ?Current Outpatient Medications:  ??  fexofenadine (ALLEGRA ALLERGY) 180 MG tablet, Take 1 tablet (180 mg total) by mouth daily., Disp: 10 tablet, Rfl: 1 ??  nitrofurantoin, macrocrystal-monohydrate, (MACROBID) 100 MG capsule, Take  1 capsule (100 mg total) by mouth 2 (two) times daily. (Patient not taking: Reported on 03/24/2021), Disp: 10 capsule, Rfl: 0  ? ? ?Assessment & Plan:  ?Burning pain in the vagina area.  ?Is NOT on BCP  ?Went to see obgyn to see them for a IUD ?LMP 25th last month.  ? ?Problem List Items Addressed This Visit   ?None ?Visit Diagnoses   ? ? Sexually transmitted disease (STD)    -  Primary  ? Relevant Orders  ? Chlamydia/Gonococcus/Trichomonas, NAA([facility])  ? HSV(herpes simplex vrs) 1+2 ab-IgG  ? Hepatitis C antibody, reflex  ? HIV antibody (with reflex)  ? Urine Culture  ? Urinalysis, Routine w reflex microscopic  ? RPR  ? WET PREP FOR [place], YEAST, CLUE  ? Pregnancy, urine (STAT)  ? ?  ?  ? ?Orders Placed This Encounter  ?Procedures  ?? Chlamydia/Gonococcus/Trichomonas, NAA([facility])  ?? Urine Culture  ?? WET PREP FOR TRICH, YEAST, CLUE  ?? HSV(herpes simplex vrs) 1+2 ab-IgG  ?? Hepatitis C antibody, reflex  ?? HIV antibody (with reflex)   ?? Urinalysis, Routine w reflex microscopic  ?? RPR  ?? Pregnancy, urine (STAT)  ?  ? ?Meds ordered this encounter  ?Medications  ?? fexofenadine (ALLEGRA ALLERGY) 180 MG tablet  ?  Sig: Take 1 tablet (180 mg total) by mouth daily.  ?  Dispense:  10 tablet  ?  Refill:  1  ?  ? ?Follow up plan: ?No follow-ups on file. ? ? ?

## 2021-04-09 ENCOUNTER — Encounter: Payer: Self-pay | Admitting: Emergency Medicine

## 2021-04-09 ENCOUNTER — Other Ambulatory Visit: Payer: Self-pay

## 2021-04-09 ENCOUNTER — Telehealth: Payer: Self-pay

## 2021-04-09 ENCOUNTER — Encounter: Payer: Self-pay | Admitting: Internal Medicine

## 2021-04-09 ENCOUNTER — Emergency Department
Admission: EM | Admit: 2021-04-09 | Discharge: 2021-04-09 | Disposition: A | Discharge status: Home / Self Care | Payer: 59 | Attending: Emergency Medicine | Admitting: Emergency Medicine

## 2021-04-09 DIAGNOSIS — N3000 Acute cystitis without hematuria: Secondary | ICD-10-CM | POA: Insufficient documentation

## 2021-04-09 DIAGNOSIS — R1084 Generalized abdominal pain: Secondary | ICD-10-CM | POA: Diagnosis present

## 2021-04-09 LAB — COMPREHENSIVE METABOLIC PANEL
ALT: 29 U/L (ref 0–44)
AST: 32 U/L (ref 15–41)
Albumin: 4.7 g/dL (ref 3.5–5.0)
Alkaline Phosphatase: 57 U/L (ref 38–126)
Anion gap: 8 (ref 5–15)
BUN: 16 mg/dL (ref 6–20)
CO2: 22 mmol/L (ref 22–32)
Calcium: 9.5 mg/dL (ref 8.9–10.3)
Chloride: 108 mmol/L (ref 98–111)
Creatinine, Ser: 0.76 mg/dL (ref 0.44–1.00)
GFR, Estimated: >60 mL/min (ref >60)
Glucose, Bld: 107 mg/dL — ABNORMAL HIGH (ref 70–99)
Potassium: 4.1 mmol/L (ref 3.5–5.1)
Sodium: 138 mmol/L (ref 135–145)
Total Bilirubin: 1.2 mg/dL (ref 0.3–1.2)
Total Protein: 8.5 g/dL — ABNORMAL HIGH (ref 6.5–8.1)

## 2021-04-09 LAB — MICROSCOPIC EXAMINATION: RBC, Urine: NONE SEEN /hpf (ref 0–2)

## 2021-04-09 LAB — CBC WITH DIFFERENTIAL/PLATELET
Abs Immature Granulocytes: 0.04 10*3/uL (ref 0.00–0.07)
Basophils Absolute: 0 10*3/uL (ref 0.0–0.1)
Basophils Relative: 0 %
Eosinophils Absolute: 0.1 10*3/uL (ref 0.0–0.5)
Eosinophils Relative: 1 %
HCT: 41 % (ref 36.0–46.0)
Hemoglobin: 13.4 g/dL (ref 12.0–15.0)
Immature Granulocytes: 0 %
Lymphocytes Relative: 13 %
Lymphs Abs: 1.3 10*3/uL (ref 0.7–4.0)
MCH: 27.9 pg (ref 26.0–34.0)
MCHC: 32.7 g/dL (ref 30.0–36.0)
MCV: 85.4 fL (ref 80.0–100.0)
Monocytes Absolute: 0.6 10*3/uL (ref 0.1–1.0)
Monocytes Relative: 6 %
Neutro Abs: 8 10*3/uL — ABNORMAL HIGH (ref 1.7–7.7)
Neutrophils Relative %: 80 %
Platelets: 334 10*3/uL (ref 150–400)
RBC: 4.8 MIL/uL (ref 3.87–5.11)
RDW: 13.4 % (ref 11.5–15.5)
WBC: 10 10*3/uL (ref 4.0–10.5)
nRBC: 0 % (ref 0.0–0.2)

## 2021-04-09 LAB — HIV ANTIBODY (ROUTINE TESTING W REFLEX): HIV Screen 4th Generation wRfx: NONREACTIVE

## 2021-04-09 LAB — URINALYSIS, ROUTINE W REFLEX MICROSCOPIC
Bacteria, UA: NONE SEEN
Bilirubin Urine: NEGATIVE
Bilirubin, UA: NEGATIVE
Glucose, UA: NEGATIVE
Glucose, UA: NEGATIVE mg/dL
Hgb urine dipstick: NEGATIVE
Ketones, UA: NEGATIVE
Ketones, ur: NEGATIVE mg/dL
Nitrite, UA: NEGATIVE
Nitrite: NEGATIVE
Protein, ur: 30 mg/dL — AB
Protein,UA: NEGATIVE
RBC, UA: NEGATIVE
Specific Gravity, UA: 1.025 (ref 1.005–1.030)
Specific Gravity, Urine: 1.027 (ref 1.005–1.030)
Urobilinogen, Ur: 0.2 mg/dL (ref 0.2–1.0)
WBC, UA: >50 WBC/hpf — ABNORMAL HIGH (ref 0–5)
pH, UA: 6.5 (ref 5.0–7.5)
pH: 7 (ref 5.0–8.0)

## 2021-04-09 LAB — WET PREP FOR TRICH, YEAST, CLUE
Clue Cell Exam: POSITIVE — AB
Trichomonas Exam: NEGATIVE
Yeast Exam: NEGATIVE

## 2021-04-09 LAB — HSV(HERPES SIMPLEX VRS) I + II AB-IGG
HSV 1 Glycoprotein G Ab, IgG: 16.5 index — ABNORMAL HIGH (ref 0.00–0.90)
HSV 2 IgG, Type Spec: 1.22 index — ABNORMAL HIGH (ref 0.00–0.90)

## 2021-04-09 LAB — PREGNANCY, URINE
Preg Test, Ur: NEGATIVE
Preg Test, Ur: NEGATIVE

## 2021-04-09 LAB — HSV-2 IGG SUPPLEMENTAL TEST

## 2021-04-09 LAB — RPR: RPR Ser Ql: NONREACTIVE

## 2021-04-09 LAB — POC URINE PREG, ED: Preg Test, Ur: NEGATIVE

## 2021-04-09 MED ORDER — CEPHALEXIN 500 MG PO CAPS: 500.0000 mg | ORAL_CAPSULE | Freq: Three times a day (TID) | ORAL | 0 refills | Status: AC

## 2021-04-09 MED ORDER — PHENAZOPYRIDINE HCL 200 MG PO TABS: 200.0000 mg | ORAL_TABLET | Freq: Three times a day (TID) | ORAL | 0 refills | Status: DC | PRN

## 2021-04-09 MED ORDER — ONDANSETRON 4 MG PO TBDP
4.0000 mg | ORAL_TABLET | Freq: Once | ORAL | Status: AC
  Administered 2021-04-09: 4 mg via ORAL
  Filled 2021-04-09: qty 1

## 2021-04-09 MED ORDER — CEFTRIAXONE SODIUM 1 G IJ SOLR
1.0000 g | Freq: Once | INTRAMUSCULAR | Status: AC
  Administered 2021-04-09: 1 g via INTRAVENOUS
  Filled 2021-04-09: qty 10

## 2021-04-09 MED ORDER — PHENAZOPYRIDINE HCL 200 MG PO TABS
200.0000 mg | ORAL_TABLET | Freq: Once | ORAL | Status: AC
  Administered 2021-04-09: 200 mg via ORAL
  Filled 2021-04-09: qty 1

## 2021-04-09 MED ORDER — ONDANSETRON 4 MG PO TBDP: 4.0000 mg | ORAL_TABLET | Freq: Three times a day (TID) | ORAL | 0 refills | Status: DC | PRN

## 2021-04-09 NOTE — Telephone Encounter (Signed)
PA started for Fexofenadine HCI 180 mg tablets through [employer]. PA approved through 04/11/22 ? ?

## 2021-04-09 NOTE — ED Provider Notes (Signed)
? ?[facility] ?Provider Note ? ? ? Event Date/Time  ? First MD Initiated Contact with Patient 04/09/21 0422   ?  (approximate) ? ? ?History  ? ?Abdominal Pain ? ? ?HPI ? ?Bethany Malone is a 26 y.o. female who reports she had a UTI couple weeks ago was not really compliant very well with the medications but it went away and then a couple days ago came back again she has urgency frequency and burning which is quite bad worse tonight.  She went to see the doctor a couple days ago who checked her out for STDs and none was found she did have a yeast infection and now she has burning when she has a UTI again.  She reports she is nauseated and vomiting and feeling hot and feverish.  Here she is tachycardic. ? ?  ? ? ?Physical Exam  ? ?Triage Vital Signs: ?ED Triage Vitals [04/09/21 0421]  ?Enc Vitals Group  ?   BP (!) 140/92  ?   Pulse Rate (!) 117  ?   Resp 16  ?   Temp 97.9 ?F (36.6 ?C)  ?   Temp Source Oral  ?   SpO2 99 %  ?   Weight 155 lb (70.3 kg)  ?   Height 5\' 6"  (1.676 m)  ?   Head Circumference   ?   Peak Flow   ?   Pain Score 10  ?   Pain Loc   ?   Pain Edu?   ?   Excl. in GC?   ? ? ?Most recent vital signs: ?Vitals:  ? 04/09/21 0421 04/09/21 0436  ?BP: (!) 140/92   ?Pulse: (!) 117 93  ?Resp: 16 20  ?Temp: 97.9 ?F (36.6 ?C)   ?SpO2: 99% 99%  ? ? ? ?General: Awake, no distress.  ?CV:  Good peripheral perfusion.  Heart regular rate and rhythm no audible murmurs ?Resp:  Normal effort.  Lungs are clear ?Abd:  No distention.  Mild diffusely tender worse suprapubically.  No CVA tenderness ?Extremities no edema ? ? ?ED Results / Procedures / Treatments  ? ?Labs ?(all labs ordered are listed, but only abnormal results are displayed) ?Labs Reviewed  ?CBC WITH DIFFERENTIAL/PLATELET - Abnormal; Notable for the following components:  ?    Result Value  ? Neutro Abs 8.0 (*)   ? All other components within normal limits  ?COMPREHENSIVE METABOLIC PANEL - Abnormal; Notable for the following components:   ? Glucose, Bld 107 (*)   ? Total Protein 8.5 (*)   ? All other components within normal limits  ?URINALYSIS, ROUTINE W REFLEX MICROSCOPIC - Abnormal; Notable for the following components:  ? Color, Urine YELLOW (*)   ? APPearance HAZY (*)   ? Protein, ur 30 (*)   ? Leukocytes,Ua SMALL (*)   ? WBC, UA >50 (*)   ? All other components within normal limits  ?URINE CULTURE  ?PREGNANCY, URINE  ?POC URINE PREG, ED  ? ? ? ?EKG ? ? ? ? ?RADIOLOGY ? ? ?PROCEDURES: ? ?Critical Care performed:  ? ?Procedures ? ? ?MEDICATIONS ORDERED IN ED: ?Medications  ?cefTRIAXone (ROCEPHIN) 1 g in sodium chloride 0.9 % 100 mL IVPB (has no administration in time range)  ? ? ? ?IMPRESSION / MDM / ASSESSMENT AND PLAN / ED COURSE  ?I reviewed the triage vital signs and the nursing notes. ?Review of old records show an office visit yesterday for possible STD I expect that was  when she was told those tests were negative.  She had a urine culture from February that was positive for E. coli with multiple sensitivities.  I will give her some IV Rocephin now and will try some Pyridium and Keflex. ?Urine today is consistent with a UTI.  She does not have an elevated white count although she has a small shift to the left on the differential.  Her LFTs etc. are okay.  There is no hematuria in the urinalysis. ?Patient does says she was told that the STD tests were negative yesterday.  I do not think we need to evaluate things further unless she continues to worsen.  I will have her return if she does get were any worse. ?We will give her some IV Rocephin tonight so that she can fill the Keflex later on tomorrow.  I will give her some Pyridium as well to help with the dysuria. ?  ? ? ?FINAL CLINICAL IMPRESSION(S) / ED DIAGNOSES  ? ?Final diagnoses:  ?Acute cystitis without hematuria  ? ? ? ?Rx / DC Orders  ? ?ED Discharge Orders   ? ?      Ordered  ?  phenazopyridine (PYRIDIUM) 200 MG tablet  3 times daily PRN       ? 04/09/21 0501  ?  cephALEXin  (KEFLEX) 500 MG capsule  3 times daily       ? 04/09/21 0501  ? ?  ?  ? ?  ? ? ? ?Note:  This document was prepared using Dragon voice recognition software and may include unintentional dictation errors. ?  ?Arnaldo Natal, MD ?04/09/21 0502 ? ?

## 2021-04-09 NOTE — ED Triage Notes (Signed)
Patient ambulatory to triage with steady gait, without difficulty or distress noted; pt reports since yesterday having pelvic/vag pain, N/V, chills and concerned she may have UTI ?

## 2021-04-09 NOTE — Discharge Instructions (Addendum)
It looks like you do have a bad UTI.  I will give you Keflex antibiotic 1 pill 3 times a day to treat the infection and Pyridium also 1 pill 3 times a day.  This should help with the burning.  It may make your urine orange and stain.  Be careful with that.  Please return for increasing fever or if you cannot keep down fluid or the medicines or feel sicker.  I will give you a dose of Pyridium now and a dose of IV antibiotics which will last for 24 hours so you can take a little bit of time to get the Keflex filled tomorrow morning.  I will give you some Zofran to help with the nausea and vomiting as well. ?

## 2021-04-09 NOTE — Progress Notes (Signed)
Pl let pt know about diagnosis, will need to get partner tx as well has had a recurrence of BV x 3 weeks. Pl see if she has picked  up her falgyl. Thnx.

## 2021-04-10 LAB — CHLAMYDIA/GONOCOCCUS/TRICHOMONAS, NAA
Chlamydia by NAA: NEGATIVE
Gonococcus by NAA: NEGATIVE
Trich vag by NAA: NEGATIVE

## 2021-04-10 LAB — URINE CULTURE: Culture: <10000 — AB

## 2021-04-10 NOTE — Progress Notes (Signed)
Needs to fu with pcp x 1 -2 weeks please thnx.

## 2021-04-10 NOTE — Addendum Note (Signed)
Addended by: Donzetta Kohut A on: 04/10/2021 09:14 AM ? ? Modules accepted: Orders ? ?

## 2021-04-11 LAB — URINE CULTURE

## 2021-04-11 LAB — SPECIMEN STATUS REPORT

## 2021-04-11 LAB — HEPATITIS C ANTIBODY: Hep C Virus Ab: NONREACTIVE

## 2021-04-13 ENCOUNTER — Ambulatory Visit: Payer: Self-pay | Admitting: *Deleted

## 2021-04-13 NOTE — Telephone Encounter (Signed)
?  Chief Complaint: yeast infection ?Symptoms: itching burning ?Frequency: constant ?Pertinent Negatives: Patient denies fever ?Disposition: [] ED /[] Urgent Care (no appt availability in office) / [] Appointment(In office/virtual)/ []  [place] [facility]/ [] Home Care/ [] Refused Recommended Disposition /[] [place] [facility]/ [x]  Follow-up with PCP ?Additional Notes: Pt has been seen by Dr. , then went to ED, was given IV antibiotic for UTI and now has terrible yeast infection. Getting very upset, does not want appt just needs prescription strength yeast medication called to [facility] . ? ?Reason for Disposition ? [1] Taking antibiotic < 72 hours (3 days) AND [2] symptoms are SAME (not improved) ? Preventing urinary tract infections (UTIs), questions about ? ?Answer Assessment - Initial Assessment Questions ?1. INFECTION: "What infection is the antibiotic being given for?" ?    UTI ?2. ANTIBIOTIC: "What antibiotic are you taking" "How many times per day?" ?    Nitrofuritan ?3. DURATION: "When was the antibiotic started?" ?    Last week ?4. MAIN CONCERN OR SYMPTOM:  "What is your main concern right now?" ?    Yeast infection ?5. BETTER-SAME-WORSE: "Are you getting better, staying the same, or getting worse compared to when you first started the antibiotics?" If getting worse, ask: "In what way?"  ?    worse ?6. FEVER: "Do you have a fever?" If Yes, ask: "What is your temperature, how was it measured, and when did it start?" ?    no ?7. SYMPTOMS: "Are there any other symptoms you're concerned about?" If Yes, ask: "When did it start?" ?    Itching burning very badly ?8. FOLLOW-UP APPOINTMENT: "Do you have a follow-up appointment with your doctor?" ?    Already saw Dr. and went to ED where was given IV antibiotic. ? ?Protocols used: Infection on Antibiotic Follow-up Call-[facility], Urinary Tract Infection on Antibiotic Follow-up Call - [facility] ? ?

## 2021-04-13 NOTE — Telephone Encounter (Signed)
Patient was seen in office 3/22. Requesting medication for yeast.  ?

## 2021-04-13 NOTE — Telephone Encounter (Signed)
Routing to provider to advise. Patient stating she is now having yeast infection symptoms due to taking an antibiotic recently.  ?

## 2021-04-13 NOTE — Telephone Encounter (Signed)
?  Chief Complaint: prolonged antibiotic use- UTI treatment and BV treatment ?Symptoms: vulvar itching and burning ?Frequency: several days ?Pertinent Negatives: Patient denies fever, discharge ?Disposition: [] ED /[] Urgent Care (no appt availability in office) / [] Appointment(In office/virtual)/ []  [place] [facility]/ [x] Home Care/ [] Refused Recommended Disposition /[] [place] [facility]/ []  Follow-up with PCP ?Additional Notes: Patient is itching and burning- with antibiotic use. Advised OTC yeast medication-  for symptoms- will send message to provider since patient has several more days of antibiotic to take.  ?

## 2021-04-13 NOTE — Telephone Encounter (Signed)
Need to know what her symptoms are, her wet prep was -ve for yeast. Pl have her fu with Dr. Shela Commons  ?Thnx

## 2021-04-13 NOTE — Telephone Encounter (Signed)
Summary: yeast infection not getting better  ? Pt called once again insisting on speaking with someone regarding her symptoms. Pt is very upset and requests call back.  ? ?----- Message from Bethany Malone sent at 04/13/2021 10:55 AM EDT -----  ?Pt called saying she seen Dr. Wynetta Emery last week then went to the ER the next day.  She was diagnosed with yeast and uti.  The Er gave her antibiotics iv and now she says the yeast is worst saying she has a burning vaginal and itching.  Please advise  She is asking another med to treat yeast.  ? ?CB#  [phone]   ?  ? ?Reason for Disposition ? [1] Symptoms of a yeast infection (i.e., itchy, white discharge, not bad smelling) AND [2] feels like prior vaginal yeast infections ? ?Answer Assessment - Initial Assessment Questions ?1. ANTIBIOTIC: "What antibiotic are you taking?" "How many times per day?" ?    Ceftin, Metronidazole ?2. DURATION: "When was the antibiotic started?" ?    Patient has 2 days of antibiotics left- metronidazole, 5 days of keflex ?3. MAIN SYMPTOM: "What is the main symptom you are concerned about?" ?    Burning and itching ?4. FEVER: "Do you have a fever?" If Yes, ask: "What is it, how was it measured, and when did it start?" ?    no ?5. OTHER SYMPTOMS: "Do you have any other symptoms?" (e.g., flank pain, vaginal discharge, blood in urine) ?    Burning and itching ? ?Answer Assessment - Initial Assessment Questions ?1. SYMPTOM: "What's the main symptom you're concerned about?" (e.g., rash, itching, swelling, dryness) ?    Itching, burning ?2. LOCATION: "Where is the itching located?" (e.g., inside/outside, left/right) ?    labia ?3. ONSET: "When did the  itching  start?" ?    Several days- but getting worse ?4. PAIN: "Is there any pain?" If Yes, ask: "How bad is it?" (Scale: 1-10; mild, moderate, severe) ?  -  MILD (1-3): doesn't interfere with normal activities  ?  -  MODERATE (4-7): interferes with normal activities (e.g., work or school) or  awakens from sleep   ?  -  SEVERE (8-10): excruciating pain, unable to do any normal activities ?    moderate ?5. CAUSE: "What do you think is causing the symptoms?" ?    Prolonged antibiotic use ?6. OTHER SYMPTOMS: "Do you have any other symptoms?" (e.g., fever, vaginal bleeding, pain with urination) ?    UTI treatment, BV treatment ?7. PREGNANCY: "Is there any chance you are pregnant?" "When was your last menstrual period?" ?    *No Answer* ? ?Protocols used: Urinary Tract Infection on Antibiotic Follow-up Call - Female-[facility], Vulvar Symptoms-[facility] ? ?

## 2021-04-14 MED ORDER — FLUCONAZOLE 150 MG PO TABS: 150.0000 mg | ORAL_TABLET | Freq: Every day | ORAL | 0 refills | Status: DC

## 2021-04-14 NOTE — Telephone Encounter (Signed)
Diflucan sent to her pharmacy

## 2021-04-14 NOTE — Telephone Encounter (Signed)
Attempted to contact patient to make aware, no answer LVM.  ?

## 2021-04-14 NOTE — Telephone Encounter (Signed)
Medication did not successfully go through to pharmacy.  ?

## 2021-04-14 NOTE — Addendum Note (Signed)
Addended by: Valerie Roys on: 04/14/2021 04:46 PM ? ? Modules accepted: Orders ? ?

## 2021-04-14 NOTE — Addendum Note (Signed)
Addended by: Dorcas Carrow on: 04/14/2021 04:52 PM ? ? Modules accepted: Orders ? ?

## 2021-04-23 ENCOUNTER — Ambulatory Visit (INDEPENDENT_AMBULATORY_CARE_PROVIDER_SITE_OTHER): Payer: 59 | Admitting: Family Medicine

## 2021-04-23 ENCOUNTER — Encounter: Payer: Self-pay | Admitting: Family Medicine

## 2021-04-23 ENCOUNTER — Ambulatory Visit: Payer: 59 | Admitting: Family Medicine

## 2021-04-23 VITALS — BP 123/83 | HR 71 | Temp 98.1°F | Wt 152.8 lb

## 2021-04-23 DIAGNOSIS — R300 Dysuria: Secondary | ICD-10-CM

## 2021-04-23 DIAGNOSIS — N898 Other specified noninflammatory disorders of vagina: Secondary | ICD-10-CM

## 2021-04-23 LAB — URINALYSIS, ROUTINE W REFLEX MICROSCOPIC: Specific Gravity, UA: 1.025 (ref 1.005–1.030)

## 2021-04-23 LAB — MICROSCOPIC EXAMINATION: WBC, UA: NONE SEEN /hpf (ref 0–5)

## 2021-04-23 LAB — WET PREP FOR TRICH, YEAST, CLUE
Clue Cell Exam: NEGATIVE
Trichomonas Exam: NEGATIVE
Yeast Exam: NEGATIVE

## 2021-04-23 MED ORDER — CIPROFLOXACIN HCL 500 MG PO TABS: 500.0000 mg | ORAL_TABLET | Freq: Two times a day (BID) | ORAL | 0 refills | Status: DC

## 2021-04-23 MED ORDER — FLUCONAZOLE 150 MG PO TABS: 150.0000 mg | ORAL_TABLET | Freq: Every day | ORAL | 0 refills | Status: DC

## 2021-04-23 NOTE — Progress Notes (Signed)
? ?BP 123/83   Pulse 71   Temp 98.1 ?F (36.7 ?C) (Oral)   Wt 152 lb 12.8 oz (69.3 kg)   SpO2 98%   BMI 24.66 kg/m?   ? ?Subjective:  ? ? Patient ID: Bethany Malone, female    DOB: [birth year], 26 y.o.   MRN: [id number] ? ?HPI: ?Bethany Malone is a 26 y.o. female ? ?Chief Complaint  ?Patient presents with  ? Urinary Tract Infection  ? ?URINARY SYMPTOMS ?Duration: about a month ?Dysuria: burning ?Urinary frequency: yes ?Urgency: yes ?Small volume voids: yes ?Symptom severity: moderate ?Urinary incontinence: no ?Foul odor: no ?Hematuria: no ?Abdominal pain: yes ?Back pain: no ?Suprapubic pain/pressure: yes ?Flank pain: no ?Fever:  no ?Vomiting: no ?Relief with cranberry juice: no ?Relief with pyridium: no ?Status: stable ?Previous urinary tract infection: yes ?Recurrent urinary tract infection: no ?Sexual activity: practicing safe sex ?History of sexually transmitted disease: no ?Vaginal discharge: no ?Treatments attempted: antibiotics, pyridium, cranberry, and increasing fluids  ? ?Relevant past medical, surgical, family and social history reviewed and updated as indicated. Interim medical history since our last visit reviewed. ?Allergies and medications reviewed and updated. ? ?Review of Systems  ?Constitutional: Negative.   ?Respiratory: Negative.    ?Cardiovascular: Negative.   ?Genitourinary:  Positive for dysuria, frequency and urgency. Negative for decreased urine volume, difficulty urinating, dyspareunia, enuresis, flank pain, genital sores, hematuria, menstrual problem, pelvic pain, vaginal bleeding, vaginal discharge and vaginal pain.  ?Musculoskeletal: Negative.   ?Neurological: Negative.   ?Psychiatric/Behavioral: Negative.    ? ?Per HPI unless specifically indicated above ? ?   ?Objective:  ?  ?BP 123/83   Pulse 71   Temp 98.1 ?F (36.7 ?C) (Oral)   Wt 152 lb 12.8 oz (69.3 kg)   SpO2 98%   BMI 24.66 kg/m?   ?Wt Readings from Last 3 Encounters:  ?04/23/21 152 lb 12.8 oz (69.3 kg)  ?04/09/21 155 lb  (70.3 kg)  ?04/08/21 155 lb 9.6 oz (70.6 kg)  ?  ?Physical Exam ?Vitals and nursing note reviewed.  ?Constitutional:   ?   General: She is not in acute distress. ?   Appearance: Normal appearance. She is not ill-appearing, toxic-appearing or diaphoretic.  ?HENT:  ?   Head: Normocephalic and atraumatic.  ?   Right Ear: External ear normal.  ?   Left Ear: External ear normal.  ?   Nose: Nose normal.  ?   Mouth/Throat:  ?   Mouth: Mucous membranes are moist.  ?   Pharynx: Oropharynx is clear.  ?Eyes:  ?   General: No scleral icterus.    ?   Right eye: No discharge.     ?   Left eye: No discharge.  ?   Extraocular Movements: Extraocular movements intact.  ?   Conjunctiva/sclera: Conjunctivae normal.  ?   Pupils: Pupils are equal, round, and reactive to light.  ?Cardiovascular:  ?   Rate and Rhythm: Normal rate and regular rhythm.  ?   Pulses: Normal pulses.  ?   Heart sounds: Normal heart sounds. No murmur heard. ?  No friction rub. No gallop.  ?Pulmonary:  ?   Effort: Pulmonary effort is normal. No respiratory distress.  ?   Breath sounds: Normal breath sounds. No stridor. No wheezing, rhonchi or rales.  ?Chest:  ?   Chest wall: No tenderness.  ?Musculoskeletal:     ?   General: Normal range of motion.  ?   Cervical back: Normal range of motion and neck supple.  ?  Skin: ?   General: Skin is warm and dry.  ?   Capillary Refill: Capillary refill takes less than 2 seconds.  ?   Coloration: Skin is not jaundiced or pale.  ?   Findings: No bruising, erythema, lesion or rash.  ?Neurological:  ?   General: No focal deficit present.  ?   Mental Status: She is alert and oriented to person, place, and time. Mental status is at baseline.  ?Psychiatric:     ?   Mood and Affect: Mood normal.     ?   Behavior: Behavior normal.     ?   Thought Content: Thought content normal.     ?   Judgment: Judgment normal.  ? ? ?Results for orders placed or performed during the hospital encounter of 04/09/21  ?Urine Culture  ? Specimen: Urine,  Clean Catch  ?Result Value Ref Range  ? Specimen Description    ?  URINE, CLEAN CATCH ?Performed at [facility], [address], [place], [place] [address] ?  ? Special Requests    ?  NONE ?Performed at [facility], [address], [place], [place] [address] ?  ? Culture (A)   ?  <10,000 COLONIES/mL INSIGNIFICANT GROWTH ?Performed at [facility], [address], [place], [place] [address] ?  ? Report Status 04/10/2021 FINAL   ?CBC with Differential  ?Result Value Ref Range  ? WBC 10.0 4.0 - 10.5 K/uL  ? RBC 4.80 3.87 - 5.11 MIL/uL  ? Hemoglobin 13.4 12.0 - 15.0 g/dL  ? HCT 41.0 36.0 - 46.0 %  ? MCV 85.4 80.0 - 100.0 fL  ? MCH 27.9 26.0 - 34.0 pg  ? MCHC 32.7 30.0 - 36.0 g/dL  ? RDW 13.4 11.5 - 15.5 %  ? Platelets 334 150 - 400 K/uL  ? nRBC 0.0 0.0 - 0.2 %  ? Neutrophils Relative % 80 %  ? Neutro Abs 8.0 (H) 1.7 - 7.7 K/uL  ? Lymphocytes Relative 13 %  ? Lymphs Abs 1.3 0.7 - 4.0 K/uL  ? Monocytes Relative 6 %  ? Monocytes Absolute 0.6 0.1 - 1.0 K/uL  ? Eosinophils Relative 1 %  ? Eosinophils Absolute 0.1 0.0 - 0.5 K/uL  ? Basophils Relative 0 %  ? Basophils Absolute 0.0 0.0 - 0.1 K/uL  ? Immature Granulocytes 0 %  ? Abs Immature Granulocytes 0.04 0.00 - 0.07 K/uL  ?Comprehensive metabolic panel  ?Result Value Ref Range  ? Sodium 138 135 - 145 mmol/L  ? Potassium 4.1 3.5 - 5.1 mmol/L  ? Chloride 108 98 - 111 mmol/L  ? CO2 22 22 - 32 mmol/L  ? Glucose, Bld 107 (H) 70 - 99 mg/dL  ? BUN 16 6 - 20 mg/dL  ? Creatinine, Ser 0.76 0.44 - 1.00 mg/dL  ? Calcium 9.5 8.9 - 10.3 mg/dL  ? Total Protein 8.5 (H) 6.5 - 8.1 g/dL  ? Albumin 4.7 3.5 - 5.0 g/dL  ? AST 32 15 - 41 U/L  ? ALT 29 0 - 44 U/L  ? Alkaline Phosphatase 57 38 - 126 U/L  ? Total Bilirubin 1.2 0.3 - 1.2 mg/dL  ? GFR, Estimated >60 >60 mL/min  ? Anion gap 8 5 - 15  ?Urinalysis, Routine w reflex microscopic  ?Result Value Ref Range  ? Color, Urine YELLOW (A) YELLOW  ? APPearance HAZY (A) CLEAR  ? Specific Gravity, Urine 1.027 1.005 - 1.030   ? pH 7.0 5.0 - 8.0  ?  Glucose, UA NEGATIVE NEGATIVE mg/dL  ? Hgb urine dipstick NEGATIVE NEGATIVE  ? Bilirubin Urine NEGATIVE NEGATIVE  ? Ketones, ur NEGATIVE NEGATIVE mg/dL  ? Protein, ur 30 (A) NEGATIVE mg/dL  ? Nitrite NEGATIVE NEGATIVE  ? Leukocytes,Ua SMALL (A) NEGATIVE  ? RBC / HPF 0-5 0 - 5 RBC/hpf  ? WBC, UA >50 (H) 0 - 5 WBC/hpf  ? Bacteria, UA NONE SEEN NONE SEEN  ? Squamous Epithelial / LPF 11-20 0 - 5  ? Mucus PRESENT   ?Pregnancy, urine  ?Result Value Ref Range  ? Preg Test, Ur NEGATIVE NEGATIVE  ?POC Urine Pregnancy, ED  ?Result Value Ref Range  ? Preg Test, Ur Negative Negative  ? ?   ?Assessment & Plan:  ? ?Problem List Items Addressed This Visit   ?None ?Visit Diagnoses   ? ? Dysuria    -  Primary  ? + nitrates on abx. Will treat with cipro and await culture. Call with any concerns.   ? Relevant Orders  ? Urinalysis, Routine w reflex microscopic  ? Urine Culture  ? Vaginal discharge      ? Wet prep normal.  ? Relevant Orders  ? WET PREP FOR TRICH, YEAST, CLUE  ? ?  ?  ? ?Follow up plan: ?Return if symptoms worsen or fail to improve. ? ? ? ? ? ?

## 2021-04-25 LAB — URINE CULTURE

## 2021-04-27 ENCOUNTER — Encounter: Payer: Self-pay | Admitting: Physician Assistant

## 2021-04-27 ENCOUNTER — Encounter: Payer: Self-pay | Admitting: Family Medicine

## 2021-04-27 ENCOUNTER — Ambulatory Visit: Payer: Self-pay

## 2021-04-27 ENCOUNTER — Ambulatory Visit (INDEPENDENT_AMBULATORY_CARE_PROVIDER_SITE_OTHER): Payer: 59 | Admitting: Physician Assistant

## 2021-04-27 VITALS — BP 116/87 | HR 83 | Temp 98.2°F | Wt 157.8 lb

## 2021-04-27 DIAGNOSIS — R300 Dysuria: Secondary | ICD-10-CM | POA: Diagnosis not present

## 2021-04-27 DIAGNOSIS — R309 Painful micturition, unspecified: Secondary | ICD-10-CM

## 2021-04-27 DIAGNOSIS — M545 Low back pain, unspecified: Secondary | ICD-10-CM

## 2021-04-27 LAB — URINALYSIS, ROUTINE W REFLEX MICROSCOPIC
Bilirubin, UA: NEGATIVE
Glucose, UA: NEGATIVE
Ketones, UA: NEGATIVE
Leukocytes,UA: NEGATIVE
Nitrite, UA: NEGATIVE
Protein,UA: NEGATIVE
RBC, UA: NEGATIVE
Specific Gravity, UA: <1.005 — ABNORMAL LOW (ref 1.005–1.030)
Urobilinogen, Ur: 0.2 mg/dL (ref 0.2–1.0)
pH, UA: 6 (ref 5.0–7.5)

## 2021-04-27 NOTE — Telephone Encounter (Signed)
he patient has been directed to contact their PCP if their medication is ineffective and symptoms continue  ? ?The patient is continuing to experience urinary discomfort after multiple weeks and previous visits  ? ?The patient was seen on Thursday 04/23/21 and directed to contact the office if they do not improve  ? ?The patient continues to feel urinary pressure and discomfort  ? ?Please contact further when possible  ? ? ?

## 2021-04-27 NOTE — Progress Notes (Signed)
? ? ?  ?    Acute Office Visit ? ? ?Patient: Bethany Malone   DOB: 14-Feb-1995   26 y.o. Female  MRN: [id number] ?Visit Date: 04/27/2021 ? ?Today's healthcare provider: Oswaldo Conroy Catherene Kaleta, PA-C  ?Introduced myself to the patient as a Secondary school teacher and provided education on APPs in clinical practice.  ? ? ?Chief Complaint  ?Patient presents with  ? Back Pain  ?  Bil low back pain radiating to bilateral flanks. States some dysuria. Having bladder pain per patient. Denies hematuria. Denies vaginal itching/ discharge.   ? ?Subjective  ?  ?HPI ?HPI   ? ? Back Pain   ? Additional comments: Bil low back pain radiating to bilateral flanks. States some dysuria. Having bladder pain per patient. Denies hematuria. Denies vaginal itching/ discharge.  ? ?  ?  ?Last edited by Junius Finner, Lysbeth Galas, CMA on 04/27/2021  3:29 PM.  ?  ?  ?States she has had a UTI for almost a month ? ?States she doesn't have much pain with urination but it's more abdominal in nature with some radiation to flanks ? ?States this morning she had pain on the left side that radiates around from the flank to the abdomen  ? ? ?Medications: ?Outpatient Medications Prior to Visit  ?Medication Sig  ? ciprofloxacin (CIPRO) 500 MG tablet Take 1 tablet (500 mg total) by mouth 2 (two) times daily.  ? fluconazole (DIFLUCAN) 150 MG tablet Take 1 tablet (150 mg total) by mouth daily. May repeat after finishes antibiotics (Patient not taking: Reported on 04/27/2021)  ? ondansetron (ZOFRAN-ODT) 4 MG disintegrating tablet Take 1 tablet (4 mg total) by mouth every 8 (eight) hours as needed for nausea or vomiting. (Patient not taking: Reported on 04/27/2021)  ? phenazopyridine (PYRIDIUM) 200 MG tablet Take 1 tablet (200 mg total) by mouth 3 (three) times daily as needed for pain. (Patient not taking: Reported on 04/27/2021)  ? [DISCONTINUED] nitrofurantoin, macrocrystal-monohydrate, (MACROBID) 100 MG capsule Take 1 capsule (100 mg total) by mouth 2 (two) times daily.  ? ?No  facility-administered medications prior to visit.  ? ? ?Review of Systems  ?Constitutional:  Positive for chills. Negative for diaphoresis and fever.  ?Gastrointestinal:  Positive for abdominal pain and nausea. Negative for diarrhea and vomiting.  ?Genitourinary:  Positive for flank pain, frequency and pelvic pain. Negative for decreased urine volume, difficulty urinating, dysuria, enuresis and urgency.  ? ? ?  Objective  ?  ?BP 116/87   Pulse 83   Temp 98.2 ?F (36.8 ?C) (Oral)   Wt 157 lb 12.8 oz (71.6 kg)   LMP 04/11/2021 (Exact Date)   SpO2 99%   BMI 25.47 kg/m?  ? ? ?Physical Exam ?Vitals reviewed.  ?Constitutional:   ?   General: She is awake.  ?   Appearance: Normal appearance. She is well-developed, well-groomed and normal weight.  ?Cardiovascular:  ?   Rate and Rhythm: Normal rate and regular rhythm.  ?   Pulses: Normal pulses.  ?   Heart sounds: Normal heart sounds.  ?Pulmonary:  ?   Effort: Pulmonary effort is normal.  ?   Breath sounds: Normal breath sounds and air entry.  ?Abdominal:  ?   General: Abdomen is flat. Bowel sounds are normal.  ?   Palpations: Abdomen is soft.  ?   Tenderness: There is abdominal tenderness in the left lower quadrant. There is no right CVA tenderness or left CVA tenderness.  ?   Hernia: No hernia is present.  ?  Neurological:  ?   Mental Status: She is alert.  ?Psychiatric:     ?   Behavior: Behavior is cooperative.  ?  ? ? ?No results found for any visits on 04/27/21. ? Assessment & Plan  ?  ? ?Problem List Items Addressed This Visit   ?None ?Visit Diagnoses   ? ? Acute bilateral low back pain, unspecified whether sciatica present    -  Primary ?Acute, new problem ?Patient reports predominantly left sided flank pain that radiates to LLQ that is slowly resolving today ?UA was negative for signs of acute infection today ?Differential to include interstitial cystitis, kidney stone, ovarian cyst, mesenteric adenitis  ?Recommend she continue Cipro 500 mg PO BID and complete  course to assist with UTI resolution ?Recommend alternating Tylenol and Ibuprofen for pain  ?Since she has had such recurrent symptoms and is concerned for further repeat issues, will place referral to Urology for further eval as needed.  ?Follow up as needed for persistent or worsening symptoms  ?Reviewed ED precautions with patient ?  ? Relevant Orders  ? Urinalysis, Routine w reflex microscopic (Completed)  ? Ambulatory referral to Urology  ? Dysuria     ?Acute, recurrent problem  ?She is currently finishing Cipro 500 mg PO BID for UTI prescribed on 04/23/2021  ?Recommend continuing until completely finished with course to assist with resolution ?UA was reassuring for resolving UTI today ?Since this is a recurrent concern (UTI encounters on 04/23/2021, 323/2023, 03/16/2021) for pt will place referral to Urology to assist with management   ? Relevant Orders  ? Ambulatory referral to Urology  ? Pain with urination      ? Relevant Orders  ? Ambulatory referral to Urology  ? ?  ? ? ? ?No follow-ups on file. ? ? ?I, Rutger Salton E Shariq Puig, PA-C, have reviewed all documentation for this visit. The documentation on 04/27/21 for the exam, diagnosis, procedures, and orders are all accurate and complete. ? ?Melisse Caetano, MHS, PA-C ?[facility] ?[facility]  ? ?No follow-ups on file.  ?   ? ? ? ? ?

## 2021-04-27 NOTE — Patient Instructions (Addendum)
Based on the results of your urinalysis today I believe that your UTI is clearing  ?Please continue to take the Cipro 500 mg by mouth twice per day until the entire course is complete.  ?There are a number of things that may cause continued discomfort and pain in the abdomen  ?At this time I recommend alternating Tylenol and Ibuprofen as needed to assist with discomfort  ?

## 2021-04-27 NOTE — Telephone Encounter (Signed)
Reason for Disposition ? Urinating more frequently than usual (i.e., frequency) ? ?Answer Assessment - Initial Assessment Questions ?1. SYMPTOM: "What's the main symptom you're concerned about?" (e.g., frequency, incontinence) ?    Seen last Thur for UTI.  If medication not working to call her back.   I was in ED 2 weeks with UTI.    I'm frustrated with the frequent UTIs.    ?2. ONSET: "When did the  frequency  start?" ?    I've had this UTI for a month.   Been on 3 different antibiotics. ?3. PAIN: "Is there any pain?" If Yes, ask: "How bad is it?" (Scale: 1-10; mild, moderate, severe) ?    *No Answer* ?4. CAUSE: "What do you think is causing the symptoms?" ?    UTI ?5. OTHER SYMPTOMS: "Do you have any other symptoms?" (e.g., fever, flank pain, blood in urine, pain with urination) ?    *No Answer* ?6. PREGNANCY: "Is there any chance you are pregnant?" "When was your last menstrual period?" ?    *No Answer* ? ?Protocols used: Urinary Symptoms-A-AH ? ?Chief Complaint: UTI not resolving after 3 rounds of antibiotics and ED visit and seeing different providers. ?Symptoms: abd pain, frequency, burning ?Frequency: On going for several weeks without resolution even with antibiotics. ?Pertinent Negatives: Patient denies N/A ?Disposition: [] ED /[] Urgent Care (no appt availability in office) / [x] Appointment(In office/virtual)/ []  [facility]/ [] Home Care/ [] Refused Recommended Disposition /[] [facility]/ []  Follow-up with PCP ?Additional Notes: Pt requested to see Dr. only however she did not have any openings after I spoke with in office to see if she could be worked in.   Pt was upset and crying because tired of the UTIs not going away, being prescribed the wrong antibiotics, etc.   "I'm just ready for this to be be taken care of".     ?

## 2021-04-28 NOTE — Telephone Encounter (Signed)
Seen by Junie Panning yesterday. ?

## 2021-04-30 ENCOUNTER — Encounter: Payer: Self-pay | Admitting: Urology

## 2021-04-30 ENCOUNTER — Ambulatory Visit (INDEPENDENT_AMBULATORY_CARE_PROVIDER_SITE_OTHER): Payer: 59 | Admitting: Urology

## 2021-04-30 VITALS — BP 139/86 | HR 102 | Ht 66.0 in | Wt 155.0 lb

## 2021-04-30 DIAGNOSIS — R109 Unspecified abdominal pain: Secondary | ICD-10-CM

## 2021-04-30 DIAGNOSIS — R1030 Lower abdominal pain, unspecified: Secondary | ICD-10-CM

## 2021-04-30 DIAGNOSIS — N390 Urinary tract infection, site not specified: Secondary | ICD-10-CM

## 2021-04-30 LAB — URINALYSIS, COMPLETE
Bilirubin, UA: NEGATIVE
Glucose, UA: NEGATIVE
Ketones, UA: NEGATIVE
Leukocytes,UA: NEGATIVE
Nitrite, UA: NEGATIVE
Protein,UA: NEGATIVE
RBC, UA: NEGATIVE
Specific Gravity, UA: >1.03 — ABNORMAL HIGH (ref 1.005–1.030)
Urobilinogen, Ur: 0.2 mg/dL (ref 0.2–1.0)
pH, UA: 6 (ref 5.0–7.5)

## 2021-04-30 LAB — MICROSCOPIC EXAMINATION

## 2021-04-30 MED ORDER — SULFAMETHOXAZOLE-TRIMETHOPRIM 400-80 MG PO TABS: 1.0000 | ORAL_TABLET | Freq: Every day | ORAL | 0 refills | Status: DC

## 2021-04-30 NOTE — Progress Notes (Signed)
? ?04/30/2021 ?2:02 PM  ? ?Levan Hurst ?04-26-1995 ?[id number] ? ?Referring provider: Mecum, Oswaldo Conroy, PA-C ?[address] ?[place],  [place] [address] ? ?Chief Complaint  ?Patient presents with  ? Dysuria  ? ? ?HPI: ?Treena Cosman is a 26 y.o. female referred for recurrent UTI. ? ?Initially diagnosed 03/16/2021 with an E. coli UTI ?Symptoms pain and burning with urination ?Treated with Macrobid with symptom resolution and also treated for bacterial vaginosis ?Had follow-up 04/08/2021 complaining of vaginal pain and burning ?STI testing negative ?Over the following morning had onset of severe abdominal pain, nausea and vomiting ?UA with pyuria.  She received IV Rocephin and was treated with cephalexin ?Urine culture grew E. coli.  No imaging was performed ?Symptoms initially resolved with antibiotic therapy however has had intermittent dysuria and occasional episodes of left flank pain radiating to the left lower quadrant was seen for/06/2021.  She was started on Cipro.  Urine culture with insignificant growth ?Presently asymptomatic. ? ? ?PMH: ?No past medical history on file. ? ?Surgical History: ?Past Surgical History:  ?Procedure Laterality Date  ? PLANTAR FASCIA SURGERY Right 2014  ? ? ?Home Medications:  ?Allergies as of 04/30/2021   ? ?   Reactions  ? Fluticasone Other (See Comments)  ? Headaches  ? ?  ? ?  ?Medication List  ?  ? ?  ? Accurate as of April 30, 2021  2:02 PM. If you have any questions, ask your nurse or doctor.  ?  ?  ? ?  ? ?STOP taking these medications   ? ?fluconazole 150 MG tablet ?Commonly known as: Diflucan ?Stopped by: Riki Altes, MD ?  ?ondansetron 4 MG disintegrating tablet ?Commonly known as: ZOFRAN-ODT ?Stopped by: Riki Altes, MD ?  ?phenazopyridine 200 MG tablet ?Commonly known as: Pyridium ?Stopped by: Riki Altes, MD ?  ? ?  ? ?TAKE these medications   ? ?ciprofloxacin 500 MG tablet ?Commonly known as: Cipro ?Take 1 tablet (500 mg total) by mouth 2 (two) times  daily. ?  ? ?  ? ? ?Allergies:  ?Allergies  ?Allergen Reactions  ? Fluticasone Other (See Comments)  ?  Headaches  ? ? ?Family History: ?Family History  ?Problem Relation Age of Onset  ? Anxiety disorder Mother   ? Rheum arthritis Mother   ? Fibromyalgia Mother   ? Anxiety disorder Sister   ? Cancer Maternal Grandfather   ? Cancer Paternal Grandmother   ? Stroke Paternal Grandfather   ? Cancer Paternal Grandfather   ? ? ?Social History:  reports that she has been smoking cigarettes. She has never used smokeless tobacco. She reports current alcohol use. She reports current drug use. Drug: Marijuana. ? ? ?Physical Exam: ?BP 139/86   Pulse (!) 102   Ht 5\' 6"  (1.676 m)   Wt 155 lb (70.3 kg)   LMP 04/11/2021 (Exact Date)   BMI 25.02 kg/m?   ?Constitutional:  Alert and oriented, No acute distress. ?HEENT: [place] AT, moist mucus membranes.  Trachea midline, no masses. ?Cardiovascular: No clubbing, cyanosis, or edema. ?Respiratory: Normal respiratory effort, no increased work of breathing. ?GI: Abdomen is soft, nontender, nondistended, no abdominal masses ?GU: No CVA tenderness ?Skin: No rashes, bruises or suspicious lesions. ?Neurologic: Grossly intact, no focal deficits, moving all 4 extremities. ?Psychiatric: Normal mood and affect. ? ?Laboratory Data: ? ?Urinalysis ?Dipstick/microscopy negative ? ? ?Assessment & Plan:   ? ?1.  Recurrent UTI ?Culture documented infections in February and March ?Intermittent symptoms with negative  culture ?She has not been sexually active since her infection started ?ED visit with severe abdominal pain and vomiting ?Some intermittent left flank pain ?Recommend scheduling stone protocol CT abdomen/pelvis ?She completes her Cipro today and will start on low-dose antibiotic suppression until the evaluation is completed ?Rx Septra single strength sent to pharmacy ? ?2.  Flank/abdominal pain ?As above ? ? ?Riki Altes, MD ? ?[facility] ?[address] ?[place], [place] [address] ?[phone] ? ?

## 2021-05-08 ENCOUNTER — Other Ambulatory Visit: Payer: Self-pay

## 2021-05-08 DIAGNOSIS — N390 Urinary tract infection, site not specified: Secondary | ICD-10-CM

## 2021-05-10 NOTE — Progress Notes (Incomplete)
05/10/21 ?2:16 PM  ? ?[name] ?05-21-1995 ?[id number] ? ?Referring provider:  ?Valerie Roys, DO ?[employer] [facility] [employer] ?[place],  [place] [address] ?No chief complaint on file. ? ? ?Urological history  ? ? ? ?HPI: ?Avaline Bian is a 26 y.o.female who presents today for further evaluation of dysuria and rUTIs.  ? ? ? ? ? ?PMH: ?No past medical history on file. ? ?Surgical History: ?Past Surgical History:  ?Procedure Laterality Date  ? PLANTAR FASCIA SURGERY Right 2014  ? ? ?Home Medications:  ?Allergies as of 05/11/2021   ? ?   Reactions  ? Fluticasone Other (See Comments)  ? Headaches  ? ?  ? ?  ?Medication List  ?  ? ?  ? Accurate as of May 10, 2021  2:16 PM. If you have any questions, ask your nurse or doctor.  ?  ?  ? ?  ? ?ciprofloxacin 500 MG tablet ?Commonly known as: Cipro ?Take 1 tablet (500 mg total) by mouth 2 (two) times daily. ?  ?sulfamethoxazole-trimethoprim 400-80 MG tablet ?Commonly known as: BACTRIM ?Take 1 tablet by mouth daily. ?  ? ?  ? ? ?Allergies:  ?Allergies  ?Allergen Reactions  ? Fluticasone Other (See Comments)  ?  Headaches  ? ? ?Family History: ?Family History  ?Problem Relation Age of Onset  ? Anxiety disorder Mother   ? Rheum arthritis Mother   ? Fibromyalgia Mother   ? Anxiety disorder Sister   ? Cancer Maternal Grandfather   ? Cancer Paternal Grandmother   ? Stroke Paternal Grandfather   ? Cancer Paternal Grandfather   ? ? ?Social History:  reports that she has been smoking cigarettes. She has never used smokeless tobacco. She reports current alcohol use. She reports current drug use. Drug: Marijuana. ? ? ?Physical Exam: ?LMP 04/11/2021 (Exact Date)   ?Constitutional:  Alert and oriented, No acute distress. ?HEENT: [place] AT, moist mucus membranes.  Trachea midline, no masses. ?Cardiovascular: No clubbing, cyanosis, or edema. ?Respiratory: Normal respiratory effort, no increased work of breathing. ?GI: Abdomen is soft, nontender, nondistended, no abdominal masses ?GU: No CVA tenderness ?Lymph:  No cervical or inguinal lymphadenopathy. ?Skin: No rashes, bruises or suspicious lesions. ?Neurologic: Grossly intact, no focal deficits, moving all 4 extremities. ?Psychiatric: Normal mood and affect. ? ?Laboratory Data: ?Lab Results  ?Component Value Date  ? CREATININE 0.76 04/09/2021  ? ?Component ?    Latest Ref Rng 04/09/2021  ?Glucose ?    70 - 99 mg/dL 107 (H)   ?BUN ?    6 - 20 mg/dL 16   ?Creatinine ?    0.44 - 1.00 mg/dL 0.76   ?Sodium ?    135 - 145 mmol/L 138   ?Potassium ?    3.5 - 5.1 mmol/L 4.1   ?Chloride ?    98 - 111 mmol/L 108   ?CO2 ?    22 - 32 mmol/L 22   ?Calcium ?    8.9 - 10.3 mg/dL 9.5   ?Total Protein ?    6.5 - 8.1 g/dL 8.5 (H)   ?Albumin ?    3.5 - 5.0 g/dL 4.7   ?Total Bilirubin ?    0.3 - 1.2 mg/dL 1.2   ?Alkaline Phosphatase ?    38 - 126 U/L 57   ?AST ?    15 - 41 U/L 32   ?ALT ?    0 - 44 U/L 29   ?GFR, Estimated ?    >60 mL/min >60   ?  Anion gap ?    5 - 15  8   ?  ?(H) High ? ?Urinalysis ? ? ?Pertinent Imaging: ? ? ?Assessment & Plan:   ? ? ?No follow-ups on file. ? ?[facility] ?[address] ?[place], [place] [address] ?[phone] ? ?I,Kailey Littlejohn,acting as a Education administrator for [employer], PA-C.,have documented all relevant documentation on the behalf of [place], PA-C,as directed by  [facility], PA-C while in the presence of [place], PA-C. ? ?

## 2021-05-11 ENCOUNTER — Ambulatory Visit: Payer: 59 | Admitting: Urology

## 2021-05-11 ENCOUNTER — Other Ambulatory Visit: Payer: Self-pay | Admitting: *Deleted

## 2021-05-11 DIAGNOSIS — N390 Urinary tract infection, site not specified: Secondary | ICD-10-CM

## 2021-05-21 ENCOUNTER — Ambulatory Visit: Admission: RE | Admit: 2021-05-21 | Payer: 59 | Source: Ambulatory Visit

## 2021-05-25 ENCOUNTER — Encounter: Payer: Self-pay | Admitting: Family Medicine

## 2021-05-27 ENCOUNTER — Telehealth: Payer: Self-pay

## 2021-05-27 ENCOUNTER — Ambulatory Visit: Payer: 59 | Admitting: Internal Medicine

## 2021-05-27 NOTE — Telephone Encounter (Signed)
LVM for patient to return call concerning her appt at 4pm. Asked if she could possibly come in earlier than 4pm since she may need to have labs and or xray ?

## 2021-05-28 ENCOUNTER — Ambulatory Visit: Payer: 59 | Attending: Urology

## 2021-06-18 ENCOUNTER — Encounter: Payer: Self-pay | Admitting: Family Medicine

## 2021-06-18 ENCOUNTER — Ambulatory Visit (INDEPENDENT_AMBULATORY_CARE_PROVIDER_SITE_OTHER): Payer: 59 | Admitting: Family Medicine

## 2021-06-18 VITALS — BP 115/74 | HR 60 | Temp 98.1°F | Wt 159.4 lb

## 2021-06-18 DIAGNOSIS — H66002 Acute suppurative otitis media without spontaneous rupture of ear drum, left ear: Secondary | ICD-10-CM | POA: Diagnosis not present

## 2021-06-18 DIAGNOSIS — W57XXXA Bitten or stung by nonvenomous insect and other nonvenomous arthropods, initial encounter: Secondary | ICD-10-CM

## 2021-06-18 DIAGNOSIS — S20361A Insect bite (nonvenomous) of right front wall of thorax, initial encounter: Secondary | ICD-10-CM

## 2021-06-18 MED ORDER — DOXYCYCLINE HYCLATE 100 MG PO TABS
100.0000 mg | ORAL_TABLET | Freq: Two times a day (BID) | ORAL | 0 refills | Status: DC
Start: 2021-06-18 — End: 2021-08-17

## 2021-06-18 NOTE — Progress Notes (Signed)
BP 115/74   Pulse 60   Temp 98.1 F (36.7 C)   Wt 159 lb 6.4 oz (72.3 kg)   SpO2 100%   BMI 25.73 kg/m    Subjective:    Patient ID: Bethany Malone, female    DOB: [birth year], 26 y.o.   MRN: [id number]  HPI: Bethany Malone is a 26 y.o. female  Chief Complaint  Patient presents with   Ear Pain    Patient states she is having pain in her right ear since Sunday.    Tick Removal    Patient states she was bit by a tick, believes it was attached for a day or two.    EAR PAIN Duration: days Involved ear(s): right Severity:  severe  Quality:  sharp Fever: no Otorrhea: no Upper respiratory infection symptoms: no Pruritus: no Hearing loss: yes Water immersion no Using Q-tips: no Recurrent otitis media: no Status: worse Treatments attempted: none  Tick Bite Duration: about a month ago  Location: R chest Onset: sudden Itching: yes Status: better Treatments attempted: none Fever: no Chills: no headache: yes Muscle pain: yes Rash: no  Relevant past medical, surgical, family and social history reviewed and updated as indicated. Interim medical history since our last visit reviewed. Allergies and medications reviewed and updated.  Review of Systems  Constitutional: Negative.   HENT:  Positive for congestion, ear pain and hearing loss. Negative for dental problem, drooling, ear discharge, facial swelling, mouth sores, nosebleeds and postnasal drip.   Respiratory: Negative.    Cardiovascular: Negative.   Gastrointestinal: Negative.   Musculoskeletal: Negative.   Psychiatric/Behavioral: Negative.     Per HPI unless specifically indicated above     Objective:    BP 115/74   Pulse 60   Temp 98.1 F (36.7 C)   Wt 159 lb 6.4 oz (72.3 kg)   SpO2 100%   BMI 25.73 kg/m   Wt Readings from Last 3 Encounters:  06/18/21 159 lb 6.4 oz (72.3 kg)  04/30/21 155 lb (70.3 kg)  04/27/21 157 lb 12.8 oz (71.6 kg)    Physical Exam Vitals and nursing note reviewed.   Constitutional:      General: She is not in acute distress.    Appearance: Normal appearance. She is normal weight. She is not ill-appearing, toxic-appearing or diaphoretic.  HENT:     Head: Normocephalic and atraumatic.     Right Ear: External ear normal. Tympanic membrane is bulging.     Left Ear: External ear normal. Tympanic membrane is erythematous and bulging.     Nose: Nose normal. No congestion or rhinorrhea.     Mouth/Throat:     Mouth: Mucous membranes are moist.     Pharynx: Oropharynx is clear. No oropharyngeal exudate or posterior oropharyngeal erythema.  Eyes:     General: No scleral icterus.       Right eye: No discharge.        Left eye: No discharge.     Extraocular Movements: Extraocular movements intact.     Conjunctiva/sclera: Conjunctivae normal.     Pupils: Pupils are equal, round, and reactive to light.  Cardiovascular:     Rate and Rhythm: Normal rate and regular rhythm.     Pulses: Normal pulses.     Heart sounds: Normal heart sounds. No murmur heard.   No friction rub. No gallop.  Pulmonary:     Effort: Pulmonary effort is normal. No respiratory distress.     Breath sounds: Normal breath sounds. No stridor.  No wheezing, rhonchi or rales.  Chest:     Chest wall: No tenderness.  Musculoskeletal:        General: Normal range of motion.     Cervical back: Normal range of motion and neck supple.  Skin:    General: Skin is warm and dry.     Capillary Refill: Capillary refill takes less than 2 seconds.     Coloration: Skin is not jaundiced or pale.     Findings: No bruising, erythema, lesion or rash.  Neurological:     General: No focal deficit present.     Mental Status: She is alert and oriented to person, place, and time. Mental status is at baseline.  Psychiatric:        Mood and Affect: Mood normal.        Behavior: Behavior normal.        Thought Content: Thought content normal.        Judgment: Judgment normal.    Results for orders placed or  performed in visit on 04/30/21  Microscopic Examination   Urine  Result Value Ref Range   WBC, UA 0-5 0 - 5 /hpf   RBC 0-2 0 - 2 /hpf   Epithelial Cells (non renal) 0-10 0 - 10 /hpf   Bacteria, UA Many (A) None seen/Few  Urinalysis, Complete  Result Value Ref Range   Specific Gravity, UA >1.030 (H) 1.005 - 1.030   pH, UA 6.0 5.0 - 7.5   Color, UA Yellow Yellow   Appearance Ur Clear Clear   Leukocytes,UA Negative Negative   Protein,UA Negative Negative/Trace   Glucose, UA Negative Negative   Ketones, UA Negative Negative   RBC, UA Negative Negative   Bilirubin, UA Negative Negative   Urobilinogen, Ur 0.2 0.2 - 1.0 mg/dL   Nitrite, UA Negative Negative   Microscopic Examination See below:       Assessment & Plan:   Problem List Items Addressed This Visit   None Visit Diagnoses     Non-recurrent acute suppurative otitis media of left ear without spontaneous rupture of tympanic membrane    -  Primary   Will treat with doxycyline. Call with any concerns. Continue to monitor.    Relevant Medications   doxycycline (VIBRA-TABS) 100 MG tablet   Tick bite of right front wall of thorax, initial encounter       Will treat with doxycyline. Call with any concerns or if not getting better.         Follow up plan: Return in about 3 months (around 09/18/2021) for physical.

## 2021-08-12 ENCOUNTER — Ambulatory Visit: Payer: Self-pay

## 2021-08-12 NOTE — Telephone Encounter (Signed)
   Chief Complaint: Right ear and jaw pain. Seen 06/18/21 for this. Symptoms: Pain Frequency:  This week Pertinent Negatives: Patient denies fever Disposition: [] ED /[] Urgent Care (no appt availability in office) / [x] Appointment(In office/virtual)/ []  [place] [facility]/ [] Home Care/ [] Refused Recommended Disposition /[] [place] [facility]/ []  Follow-up with PCP Additional Notes: Requests appointment Monday.  Reason for Disposition  Earache  (Exceptions: brief ear pain of < 60 minutes duration, earache occurring during air travel  Answer Assessment - Initial Assessment Questions 1. LOCATION: "Which ear is involved?"     Right side 2. ONSET: "When did the ear start hurting"      5 days ago jaw pain started 3. SEVERITY: "How bad is the pain?"  (Scale 1-10; mild, moderate or severe)   - MILD (1-3): doesn't interfere with normal activities    - MODERATE (4-7): interferes with normal activities or awakens from sleep    - SEVERE (8-10): excruciating pain, unable to do any normal activities      5 4. URI SYMPTOMS: "Do you have a runny nose or cough?"     No 5. FEVER: "Do you have a fever?" If Yes, ask: "What is your temperature, how was it measured, and when did it start?"     No 6. CAUSE: "Have you been swimming recently?", "How often do you use Q-TIPS?", "Have you had any recent air travel or scuba diving?"     Swimming 7. OTHER SYMPTOMS: "Do you have any other symptoms?" (e.g., headache, stiff neck, dizziness, vomiting, runny nose, decreased hearing)     Jaw pain 8. PREGNANCY: "Is there any chance you are pregnant?" "When was your last menstrual period?"     No  Protocols used: 

## 2021-08-16 NOTE — Patient Instructions (Incomplete)
Temporomandibular Joint Syndrome  Temporomandibular joint syndrome (TMJ syndrome) is a condition that causes pain in the temporomandibular joints. These joints are located near your ears and allow your jaw to open and close. For people with TMJ syndrome, chewing, biting, or other movements of the jaw can be difficult or painful. TMJ syndrome is often mild and goes away within a few weeks. However, sometimes the condition becomes a long-term (chronic) problem. What are the causes? This condition may be caused by: Grinding your teeth or clenching your jaw. Some people do this when they are stressed. Arthritis. An injury to the jaw. A head or neck injury. Teeth or dentures that are not aligned well. In some cases, the cause of TMJ syndrome may not be known. What are the signs or symptoms? The most common symptom of this condition is aching pain on the side of the head in the area of the TMJ. Other symptoms may include: Pain when moving your jaw, such as when chewing or biting. Not being able to open your jaw all the way. Making a clicking sound when you open your mouth. Headache. Earache. Neck or shoulder pain. How is this diagnosed? This condition may be diagnosed based on: Your symptoms and medical history. A physical exam. Your health care provider may check the range of motion of your jaw. Imaging tests, such as X-rays or an MRI. You may also need to see your dentist, who will check if your teeth and jaw are lined up correctly. How is this treated? TMJ syndrome often goes away on its own. If treatment is needed, it may include: Eating soft foods and applying ice or heat. Medicines to relieve pain or inflammation. Medicines or massage to relax the muscles. A splint, bite plate, or mouthpiece to prevent teeth grinding or jaw clenching. Relaxation techniques or counseling to help reduce stress. A therapy for pain in which an electrical current is applied to the nerves through the skin  (transcutaneous electrical nerve stimulation). Acupuncture. This may help to relieve pain. Jaw surgery. This is rarely needed. Follow these instructions at home:  Eating and drinking Eat a soft diet if you are having trouble chewing. Avoid foods that require a lot of chewing. Do not chew gum. General instructions Take over-the-counter and prescription medicines only as told by your health care provider. If directed, put ice on the painful area. To do this: Put ice in a plastic bag. Place a towel between your skin and the bag. Leave the ice on for 20 minutes, 2-3 times a day. Remove the ice if your skin turns bright red. This is very important. If you cannot feel pain, heat, or cold, you have a greater risk of damage to the area. Apply a warm, wet cloth (warm compress) to the painful area as told. Massage your jaw area and do any jaw stretching exercises as told by your health care provider. If you were given a splint, bite plate, or mouthpiece, wear it as told by your health care provider. Keep all follow-up visits. This is important. Where to find more information National Institute of Dental and Craniofacial Research: www.nidcr.nih.gov Contact a health care provider if: You have trouble eating. You have new or worsening symptoms. Get help right away if: Your jaw locks. Summary Temporomandibular joint syndrome (TMJ syndrome) is a condition that causes pain in the temporomandibular joints. These joints are located near your ears and allow your jaw to open and close. TMJ syndrome is often mild and goes away within   a few weeks. However, sometimes the condition becomes a long-term (chronic) problem. Symptoms include an aching pain on the side of the head in the area of the TMJ, pain when chewing or biting, and being unable to open your jaw all the way. You may also make a clicking sound when you open your mouth. TMJ syndrome often goes away on its own. If treatment is needed, it may  include medicines to relieve pain, reduce inflammation, or relax the muscles. A splint, bite plate, or mouthpiece may also be used to prevent teeth grinding or jaw clenching. This information is not intended to replace advice given to you by your health care provider. Make sure you discuss any questions you have with your health care provider. Document Revised: 08/17/2020 Document Reviewed: 08/17/2020 Elsevier Patient Education  2023 [employer] Inc.  

## 2021-08-17 ENCOUNTER — Ambulatory Visit (INDEPENDENT_AMBULATORY_CARE_PROVIDER_SITE_OTHER): Payer: 59 | Admitting: Nurse Practitioner

## 2021-08-17 ENCOUNTER — Encounter: Payer: Self-pay | Admitting: Nurse Practitioner

## 2021-08-17 DIAGNOSIS — M26621 Arthralgia of right temporomandibular joint: Secondary | ICD-10-CM | POA: Diagnosis not present

## 2021-08-17 HISTORY — DX: Arthralgia of right temporomandibular joint: M26.621

## 2021-08-17 MED ORDER — PREDNISONE 20 MG PO TABS: 40.0000 mg | ORAL_TABLET | Freq: Every day | ORAL | 0 refills | Status: AC

## 2021-08-17 MED ORDER — CYCLOBENZAPRINE HCL 5 MG PO TABS: 5.0000 mg | ORAL_TABLET | Freq: Three times a day (TID) | ORAL | 0 refills | Status: DC | PRN

## 2021-08-17 NOTE — Progress Notes (Signed)
Acute Office Visit  Subjective:     Patient ID: Bethany Malone, female    DOB: 1995-09-05, 26 y.o.   MRN: [id number]  Chief Complaint  Patient presents with   Jaw Pain    Patient says she is having issues with her right side jaw near her ear. Patient says it has been really hard for her to eat. Patient says it has been giving her issues for the past week. Patient denies trying any over the counter medications.     Started with right ear infection over a month ago, took Doxycycline for this.  Ear improved, but then jaw started hurting one week ago after she got water out of ear.  Does endorse chewing more gum, which has made it hurt more.  Otalgia  Affected ear: pain to right jaw. This is a new problem. The current episode started in the past 7 days. The problem occurs constantly. The problem has been unchanged. There has been no fever. Fever duration: no fever. The pain is at a severity of 7/10. The pain is moderate. Pertinent negatives include no ear discharge, hearing loss, neck pain, rhinorrhea or sore throat. She has tried cold packs for the symptoms. The treatment provided no relief. There is no history of hearing loss.   Patient is in today for right jaw pain for one week.  Review of Systems  Constitutional:  Negative for chills, fever and malaise/fatigue.  HENT:  Negative for congestion, ear discharge, ear pain, hearing loss, rhinorrhea, sinus pain and sore throat.   Respiratory: Negative.    Cardiovascular: Negative.   Gastrointestinal: Negative.   Musculoskeletal:  Negative for neck pain.  Neurological: Negative.       Objective:    BP 101/68   Pulse 76   Temp 97.9 F (36.6 C) (Oral)   Ht 5\' 6"  (1.676 m)   Wt 159 lb 3.2 oz (72.2 kg)   SpO2 98%   BMI 25.70 kg/m    Physical Exam Vitals and nursing note reviewed.  Constitutional:      General: She is awake. She is not in acute distress.    Appearance: She is well-developed and well-groomed. She is not  ill-appearing or toxic-appearing.  HENT:     Head: Normocephalic.     Jaw: Tenderness (mild to right side) and pain on movement (mild to right side) present. No swelling.     Right Ear: Hearing, tympanic membrane, ear canal and external ear normal.     Left Ear: Hearing, tympanic membrane, ear canal and external ear normal.     Nose: Nose normal.     Right Sinus: No maxillary sinus tenderness or frontal sinus tenderness.     Left Sinus: No maxillary sinus tenderness or frontal sinus tenderness.     Mouth/Throat:     Mouth: Mucous membranes are moist.     Pharynx: No pharyngeal swelling, oropharyngeal exudate or posterior oropharyngeal erythema.  Eyes:     General: Lids are normal.        Right eye: No discharge.        Left eye: No discharge.     Conjunctiva/sclera: Conjunctivae normal.     Pupils: Pupils are equal, round, and reactive to light.  Neck:     Thyroid: No thyromegaly.     Vascular: No carotid bruit.  Cardiovascular:     Rate and Rhythm: Normal rate and regular rhythm.     Heart sounds: Normal heart sounds. No murmur heard.    No  gallop.  Pulmonary:     Effort: Pulmonary effort is normal. No accessory muscle usage or respiratory distress.     Breath sounds: Normal breath sounds.  Abdominal:     General: Bowel sounds are normal.     Palpations: Abdomen is soft.  Musculoskeletal:     Cervical back: Normal range of motion and neck supple.     Right lower leg: No edema.     Left lower leg: No edema.  Lymphadenopathy:     Cervical: No cervical adenopathy.  Skin:    General: Skin is warm and dry.  Neurological:     Mental Status: She is alert and oriented to person, place, and time.  Psychiatric:        Attention and Perception: Attention normal.        Mood and Affect: Mood normal.        Behavior: Behavior normal. Behavior is cooperative.        Thought Content: Thought content normal.        Judgment: Judgment normal.       Assessment & Plan:   Problem  List Items Addressed This Visit       Other   TMJ tenderness, right    Acute for one week after ear infection.  Have highly recommended she not chew gum and stick to softer foods until jaw discomfort improved.  Will send in muscle relaxer to take as needed, no driving or working with this, plus Prednisone 5 days for inflammation.  Recommend Ibuprofen at home as needed + alternate ice and heat.  If no improvement in 2 weeks then return to office and will consider PT referral.       Meds ordered this encounter  Medications   predniSONE (DELTASONE) 20 MG tablet    Sig: Take 2 tablets (40 mg total) by mouth daily with breakfast for 5 days.    Dispense:  10 tablet    Refill:  0   cyclobenzaprine (FLEXERIL) 5 MG tablet    Sig: Take 1 tablet (5 mg total) by mouth 3 (three) times daily as needed for muscle spasms.    Dispense:  30 tablet    Refill:  0    Return if symptoms worsen or fail to improve.  Marjie Skiff, NP

## 2021-08-17 NOTE — Assessment & Plan Note (Signed)
Acute for one week after ear infection.  Have highly recommended she not chew gum and stick to softer foods until jaw discomfort improved.  Will send in muscle relaxer to take as needed, no driving or working with this, plus Prednisone 5 days for inflammation.  Recommend Ibuprofen at home as needed + alternate ice and heat.  If no improvement in 2 weeks then return to office and will consider PT referral.

## 2022-01-19 ENCOUNTER — Other Ambulatory Visit: Payer: 59

## 2022-01-19 ENCOUNTER — Encounter: Payer: Self-pay | Admitting: Nurse Practitioner

## 2022-01-19 ENCOUNTER — Telehealth (INDEPENDENT_AMBULATORY_CARE_PROVIDER_SITE_OTHER): Payer: Medicaid Other | Admitting: Nurse Practitioner

## 2022-01-19 ENCOUNTER — Other Ambulatory Visit: Payer: Self-pay | Admitting: Nurse Practitioner

## 2022-01-19 ENCOUNTER — Ambulatory Visit: Payer: Self-pay

## 2022-01-19 DIAGNOSIS — N898 Other specified noninflammatory disorders of vagina: Secondary | ICD-10-CM

## 2022-01-19 LAB — URINALYSIS, ROUTINE W REFLEX MICROSCOPIC
Bilirubin, UA: NEGATIVE
Glucose, UA: NEGATIVE
Ketones, UA: NEGATIVE
Leukocytes,UA: NEGATIVE
Nitrite, UA: NEGATIVE
Protein,UA: NEGATIVE
RBC, UA: NEGATIVE
Specific Gravity, UA: 1.025 (ref 1.005–1.030)
Urobilinogen, Ur: 0.2 mg/dL (ref 0.2–1.0)
pH, UA: 6 (ref 5.0–7.5)

## 2022-01-19 LAB — WET PREP FOR TRICH, YEAST, CLUE
Clue Cell Exam: NEGATIVE
Trichomonas Exam: NEGATIVE
Yeast Exam: NEGATIVE

## 2022-01-19 NOTE — Patient Instructions (Signed)

## 2022-01-19 NOTE — Assessment & Plan Note (Signed)
Acute for a few days.  UA in office with reassuring results and wet prep negative.  Discussed with patient.  Recommend she try OTC Monistat on vaginal area for pruritus and if any worsening symptoms alert provider and will retest.

## 2022-01-19 NOTE — Progress Notes (Signed)
There were no vitals taken for this visit.   Subjective:    Patient ID: Bethany Malone, female    DOB: [birth year], 27 y.o.   MRN: [id number]  HPI: Bethany Malone is a 27 y.o. female  Chief Complaint  Patient presents with   Vaginal Itching    With vaginal odor, started on Wednesday last week. Patient was in earlier for lab visit for swabs and urine    This visit was completed via telephone due to the restrictions of the COVID-19 pandemic. All issues as above were discussed and addressed but no physical exam was performed. If it was felt that the patient should be evaluated in the office, they were directed there. The patient verbally consented to this visit. Patient was unable to complete an audio/visual visit due to Technical difficulties", "Lack of internet. Due to the catastrophic nature of the COVID-19 pandemic, this visit was done through audio contact only. Location of the patient: home Location of the provider: work Those involved with this call:  Provider: Marnee Guarneri, DNP CMA: Frazier Butt, CMA Front Desk/Registration: [employer]  Time spent on call:  21 minutes on the phone discussing health concerns. 15 minutes total spent in review of patient's record and preparation of their chart.  I verified patient identity using two factors (patient name and date of birth). Patient consents verbally to being seen via telemedicine visit today.    VAGINAL DISCHARGE Reports she is having itching in vaginal area.  Present for a 5 days. Duration: days Discharge description: mucous  Pruritus: yes Dysuria: no Malodorous: yes Urinary frequency: no Fevers: no Abdominal pain: no  Sexual activity: monogamous History of sexually transmitted diseases: yes Recent antibiotic use: no Context: stable Treatments attempted: none   Relevant past medical, surgical, family and social history reviewed and updated as indicated. Interim medical history since our last visit  reviewed. Allergies and medications reviewed and updated.  Review of Systems  Constitutional:  Negative for activity change, appetite change, diaphoresis, fatigue and fever.  Respiratory:  Negative for cough, chest tightness and shortness of breath.   Cardiovascular:  Negative for chest pain, palpitations and leg swelling.  Gastrointestinal: Negative.   Genitourinary:  Positive for vaginal discharge. Negative for vaginal pain.  Neurological: Negative.   Psychiatric/Behavioral: Negative.      Per HPI unless specifically indicated above     Objective:    There were no vitals taken for this visit.  Wt Readings from Last 3 Encounters:  08/17/21 159 lb 3.2 oz (72.2 kg)  06/18/21 159 lb 6.4 oz (72.3 kg)  04/30/21 155 lb (70.3 kg)    Physical Exam  Unable to perform due to telephone visit only.  Results for orders placed or performed in visit on 01/19/22  WET PREP FOR [place], YEAST, CLUE   Specimen: Sterile Swab   Sterile Swab  Result Value Ref Range   Trichomonas Exam Negative Negative   Yeast Exam Negative Negative   Clue Cell Exam Negative Negative  Urinalysis, Routine w reflex microscopic  Result Value Ref Range   Specific Gravity, UA 1.025 1.005 - 1.030   pH, UA 6.0 5.0 - 7.5   Color, UA Yellow Yellow   Appearance Ur Clear Clear   Leukocytes,UA Negative Negative   Protein,UA Negative Negative/Trace   Glucose, UA Negative Negative   Ketones, UA Negative Negative   RBC, UA Negative Negative   Bilirubin, UA Negative Negative   Urobilinogen, Ur 0.2 0.2 - 1.0 mg/dL   Nitrite, UA Negative  Negative   Microscopic Examination Comment       Assessment & Plan:   Problem List Items Addressed This Visit       Other   Vaginal discharge - Primary    Acute for a few days.  UA in office with reassuring results and wet prep negative.  Discussed with patient.  Recommend she try OTC Monistat on vaginal area for pruritus and if any worsening symptoms alert provider and will  retest.       I discussed the assessment and treatment plan with the patient. The patient was provided an opportunity to ask questions and all were answered. The patient agreed with the plan and demonstrated an understanding of the instructions.   The patient was advised to call back or seek an in-person evaluation if the symptoms worsen or if the condition fails to improve as anticipated.   I provided 21+ minutes of time during this encounter.    Follow up plan: Return if symptoms worsen or fail to improve.

## 2022-01-19 NOTE — Telephone Encounter (Signed)
Pt scheduled this afternoon with Jolene 

## 2022-01-19 NOTE — Telephone Encounter (Signed)
  Chief Complaint: Vaginal itching and discharge Symptoms: Above Frequency:  Pertinent Negatives: Patient denies pain Disposition: [] ED /[] Urgent Care (no appt availability in office) / [] Appointment(In office/virtual)/ []  [place] [facility]/ [] Home Care/ [] Refused Recommended Disposition /[]  Mobile Bus/ [x]  Follow-up with PCP Additional Notes: Pt is having off smelling vaginal discharge and itching. Pt is unsure if this is BV or yeast infection. Pt would like to be worked in today  for testing.   Please return pt's call.     Summary: BV and yeast infection   Patient experiencing BV or possible yeast infection. Patient unable to wait until the next available appointment for Thursday. Practice has no appointment unsure if she can be worked in.     Reason for Disposition  MODERATE-SEVERE itching (i.e., interferes with school, work, or sleep)  Answer Assessment - Initial Assessment Questions 1. SYMPTOM: "What's the main symptom you're concerned about?" (e.g., pain, itching, dryness)     Itching, discharge 2. LOCATION: "Where is the   located?" (e.g., inside/outside, left/right)     vagina 3. ONSET: "When did the    start?"      4. PAIN: "Is there any pain?" If Yes, ask: "How bad is it?" (Scale: 1-10; mild, moderate, severe)   -  MILD (1-3): Doesn't interfere with normal activities.    -  MODERATE (4-7): Interferes with normal activities (e.g., work or school) or awakens from sleep.     -  SEVERE (8-10): Excruciating pain, unable to do any normal activities.      5. ITCHING: "Is there any itching?" If Yes, ask: "How bad is it?" (Scale: 1-10; mild, moderate, severe)     mild 6. CAUSE: "What do you think is causing the discharge?" "Have you had the same problem before? What happened then?"     BV or yeast infection 7. OTHER SYMPTOMS: "Do you have any other symptoms?" (e.g., fever, itching, vaginal bleeding, pain with urination, injury to genital area, vaginal foreign  body)      8. PREGNANCY: "Is there any chance you are pregnant?" "When was your last menstrual period?"  Protocols used: Vaginal Symptoms-A-AH

## 2022-01-19 NOTE — Progress Notes (Signed)
Contacted via MyChart   Urine and wet prep all normal:)

## 2022-02-02 IMAGING — DX DG CHEST 2V
3 series · 3 of 3 positions shown · non-contrast
Comparison: None.

CLINICAL DATA: Shortness of breath for several months

EXAM:
CHEST - 2 VIEW

[chest pa (1 of 2)]
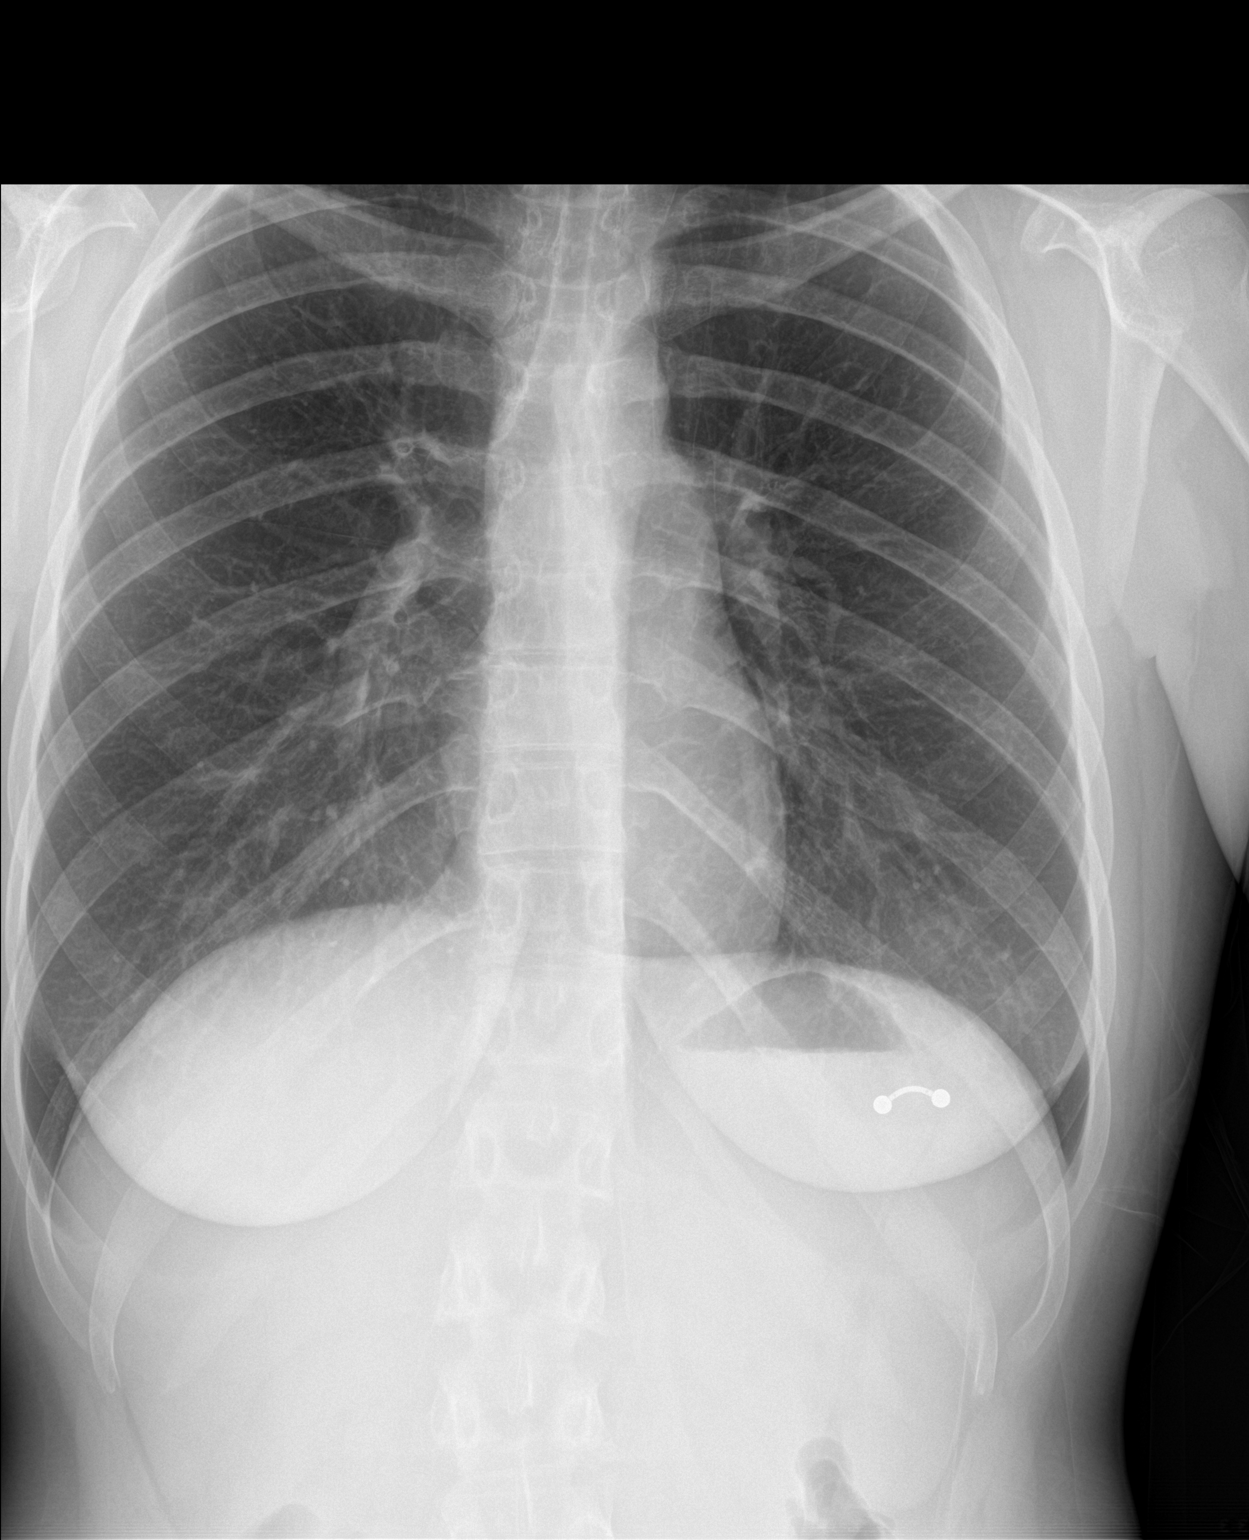

[chest lat]
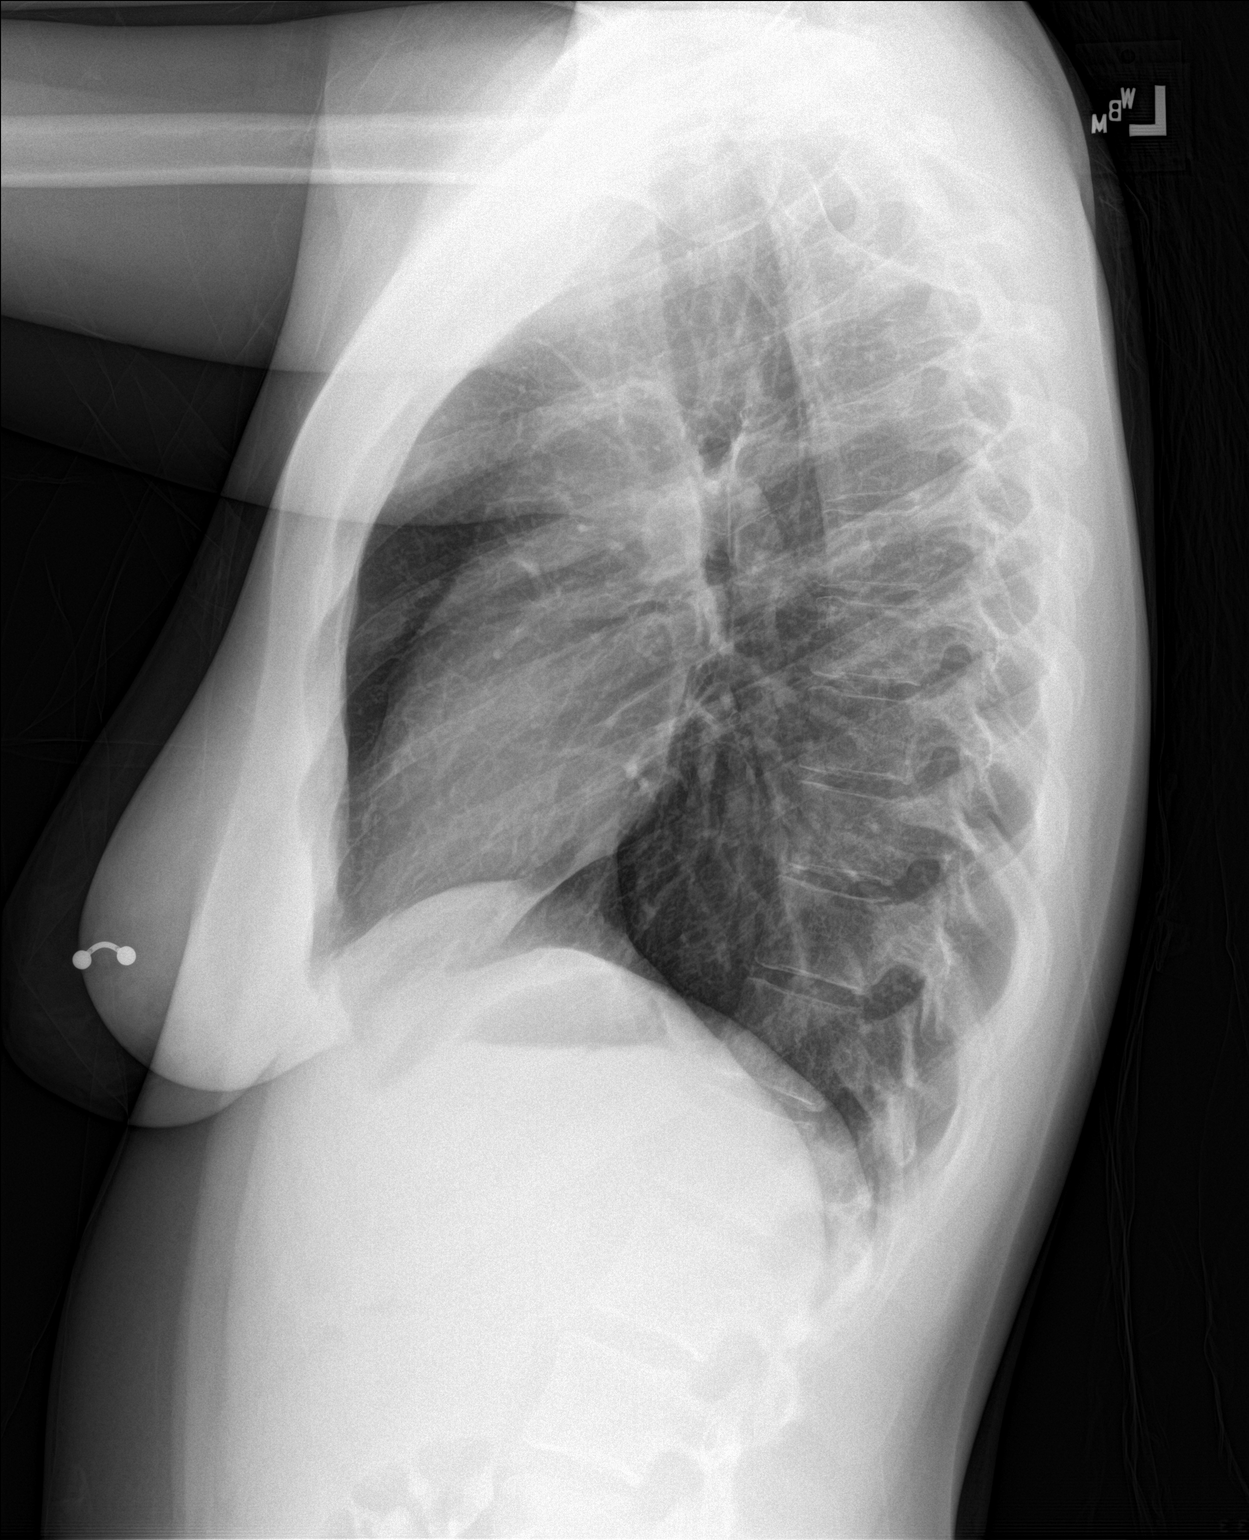

[chest pa (2 of 2)]
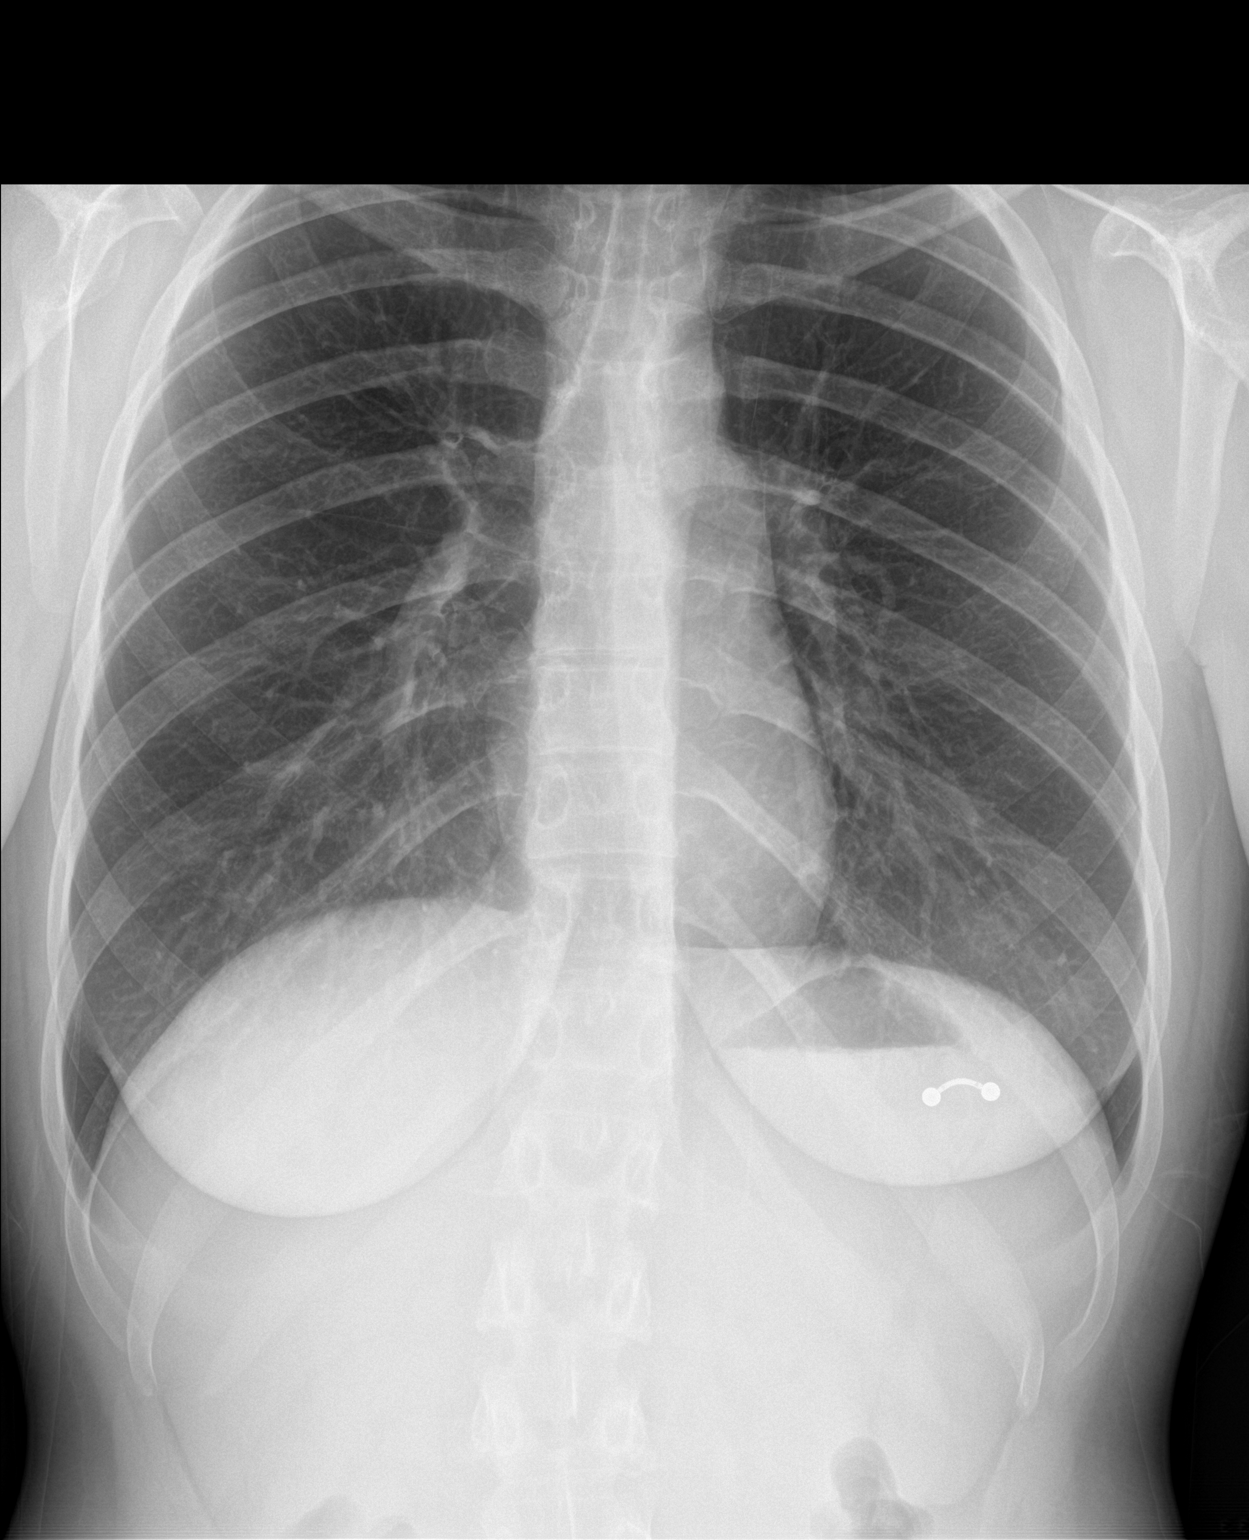

[3 of 3 positions shown; findings below may reference images not displayed]

FINDINGS: The heart size and mediastinal contours are within normal limits.
Both lungs are clear. The visualized skeletal structures are
unremarkable.
IMPRESSION: No active cardiopulmonary disease.

## 2022-03-23 ENCOUNTER — Encounter: Payer: Self-pay | Admitting: Nurse Practitioner

## 2022-03-23 ENCOUNTER — Ambulatory Visit (INDEPENDENT_AMBULATORY_CARE_PROVIDER_SITE_OTHER): Payer: Medicaid Other | Admitting: Nurse Practitioner

## 2022-03-23 VITALS — BP 113/74 | HR 92 | Temp 97.9°F | Wt 149.8 lb

## 2022-03-23 DIAGNOSIS — B9689 Other specified bacterial agents as the cause of diseases classified elsewhere: Secondary | ICD-10-CM

## 2022-03-23 DIAGNOSIS — N949 Unspecified condition associated with female genital organs and menstrual cycle: Secondary | ICD-10-CM | POA: Diagnosis not present

## 2022-03-23 DIAGNOSIS — N760 Acute vaginitis: Secondary | ICD-10-CM | POA: Diagnosis not present

## 2022-03-23 LAB — PREGNANCY, URINE: Preg Test, Ur: NEGATIVE

## 2022-03-23 LAB — URINALYSIS, ROUTINE W REFLEX MICROSCOPIC
Bilirubin, UA: NEGATIVE
Glucose, UA: NEGATIVE
Ketones, UA: NEGATIVE
Leukocytes,UA: NEGATIVE
Nitrite, UA: NEGATIVE
Protein,UA: NEGATIVE
RBC, UA: NEGATIVE
Specific Gravity, UA: 1.025 (ref 1.005–1.030)
Urobilinogen, Ur: 0.2 mg/dL (ref 0.2–1.0)
pH, UA: 6.5 (ref 5.0–7.5)

## 2022-03-23 LAB — WET PREP FOR TRICH, YEAST, CLUE
Clue Cell Exam: POSITIVE — AB
Trichomonas Exam: NEGATIVE
Yeast Exam: NEGATIVE

## 2022-03-23 MED ORDER — METRONIDAZOLE 500 MG PO TABS: 500.0000 mg | ORAL_TABLET | Freq: Two times a day (BID) | ORAL | 0 refills | Status: AC

## 2022-03-23 NOTE — Progress Notes (Signed)
BP 113/74   Pulse 92   Temp 97.9 F (36.6 C) (Oral)   Wt 149 lb 12.8 oz (67.9 kg)   SpO2 98%   BMI 24.18 kg/m    Subjective:    Patient ID: Bethany Malone, female    DOB: 04/12/1995, 27 y.o.   MRN: [id number]  HPI: Bethany Malone is a 27 y.o. female  Chief Complaint  Patient presents with   Vaginal Itching    Pt states she has been having some vaginal itching and vaginal odor   STD Labs    Patient is requesting to have STD labs if negative for yeast and bacterial infection and pregnancy test as well   VAGINAL DISCOMFORT Duration: days- then has worsened Discharge description: white (a [place] cheesy) Pruritus: yes Dysuria: no Malodorous: worse after sex Urinary frequency: no Fevers: no Abdominal pain: no  Sexual activity: monogamous History of sexually transmitted diseases: no Recent antibiotic use: no Context:  one BV infection in the past Treatments attempted: antifungal- did not improve with OTC  Relevant past medical, surgical, family and social history reviewed and updated as indicated. Interim medical history since our last visit reviewed. Allergies and medications reviewed and updated.  Review of Systems  Genitourinary:  Positive for vaginal discharge. Negative for dysuria and urgency.       Vaginal itching    Per HPI unless specifically indicated above     Objective:    BP 113/74   Pulse 92   Temp 97.9 F (36.6 C) (Oral)   Wt 149 lb 12.8 oz (67.9 kg)   SpO2 98%   BMI 24.18 kg/m   Wt Readings from Last 3 Encounters:  03/23/22 149 lb 12.8 oz (67.9 kg)  08/17/21 159 lb 3.2 oz (72.2 kg)  06/18/21 159 lb 6.4 oz (72.3 kg)    Physical Exam Vitals and nursing note reviewed.  Constitutional:      General: She is not in acute distress.    Appearance: Normal appearance. She is normal weight. She is not ill-appearing, toxic-appearing or diaphoretic.  HENT:     Head: Normocephalic.     Right Ear: External ear normal.     Left Ear: External  ear normal.     Nose: Nose normal.     Mouth/Throat:     Mouth: Mucous membranes are moist.     Pharynx: Oropharynx is clear.  Eyes:     General:        Right eye: No discharge.        Left eye: No discharge.     Extraocular Movements: Extraocular movements intact.     Conjunctiva/sclera: Conjunctivae normal.     Pupils: Pupils are equal, round, and reactive to light.  Cardiovascular:     Rate and Rhythm: Normal rate and regular rhythm.     Heart sounds: No murmur heard. Pulmonary:     Effort: Pulmonary effort is normal. No respiratory distress.     Breath sounds: Normal breath sounds. No wheezing or rales.  Abdominal:     General: Abdomen is flat. Bowel sounds are normal. There is no distension.     Palpations: Abdomen is soft. There is no mass.     Tenderness: There is no abdominal tenderness. There is no right CVA tenderness, left CVA tenderness, guarding or rebound.     Hernia: No hernia is present.  Musculoskeletal:     Cervical back: Normal range of motion and neck supple.  Skin:    General: Skin is  warm and dry.     Capillary Refill: Capillary refill takes less than 2 seconds.  Neurological:     General: No focal deficit present.     Mental Status: She is alert and oriented to person, place, and time. Mental status is at baseline.  Psychiatric:        Mood and Affect: Mood normal.        Behavior: Behavior normal.        Thought Content: Thought content normal.        Judgment: Judgment normal.     Results for orders placed or performed in visit on 01/19/22  WET PREP FOR [place], YEAST, CLUE   Specimen: Sterile Swab   Sterile Swab  Result Value Ref Range   Trichomonas Exam Negative Negative   Yeast Exam Negative Negative   Clue Cell Exam Negative Negative  Urinalysis, Routine w reflex microscopic  Result Value Ref Range   Specific Gravity, UA 1.025 1.005 - 1.030   pH, UA 6.0 5.0 - 7.5   Color, UA Yellow Yellow   Appearance Ur Clear Clear   Leukocytes,UA  Negative Negative   Protein,UA Negative Negative/Trace   Glucose, UA Negative Negative   Ketones, UA Negative Negative   RBC, UA Negative Negative   Bilirubin, UA Negative Negative   Urobilinogen, Ur 0.2 0.2 - 1.0 mg/dL   Nitrite, UA Negative Negative   Microscopic Examination Comment       Assessment & Plan:   Problem List Items Addressed This Visit   None Visit Diagnoses     BV (bacterial vaginosis)    -  Primary   Complete course of antibiotics.  Avoid alcohol.  Follow up if not improved.   Relevant Medications   metroNIDAZOLE (FLAGYL) 500 MG tablet   Vaginal discomfort       Relevant Orders   Urinalysis, Routine w reflex microscopic   WET PREP FOR TRICH, YEAST, CLUE   Pregnancy, urine   HIV Antibody (routine testing w rflx)   RPR   Hepatitis C antibody   HSV(herpes simplex vrs) 1+2 ab-IgG        Follow up plan: Return if symptoms worsen or fail to improve.

## 2022-03-24 NOTE — Progress Notes (Signed)
Hi Bethany Malone. It was nice to meet you yesterday.  Your lab work looks good.  You didn't get the STI testing done.  If you want to come back and do that let me know I will make sure the orders are in.  If you have questions let me know.

## 2022-05-21 ENCOUNTER — Ambulatory Visit: Payer: 59 | Admitting: Family Medicine

## 2022-05-21 ENCOUNTER — Ambulatory Visit: Payer: Self-pay | Admitting: Physician Assistant

## 2022-05-21 ENCOUNTER — Encounter: Payer: Self-pay | Admitting: Family Medicine

## 2022-05-21 VITALS — BP 127/68 | HR 67 | Temp 98.2°F | Ht 66.0 in | Wt 147.2 lb

## 2022-05-21 DIAGNOSIS — R300 Dysuria: Secondary | ICD-10-CM

## 2022-05-21 DIAGNOSIS — B9689 Other specified bacterial agents as the cause of diseases classified elsewhere: Secondary | ICD-10-CM | POA: Diagnosis not present

## 2022-05-21 DIAGNOSIS — N760 Acute vaginitis: Secondary | ICD-10-CM | POA: Diagnosis not present

## 2022-05-21 DIAGNOSIS — Z23 Encounter for immunization: Secondary | ICD-10-CM

## 2022-05-21 LAB — URINALYSIS, ROUTINE W REFLEX MICROSCOPIC
Bilirubin, UA: NEGATIVE
Glucose, UA: NEGATIVE
Ketones, UA: NEGATIVE
Nitrite, UA: NEGATIVE
RBC, UA: NEGATIVE
Specific Gravity, UA: 1.02 (ref 1.005–1.030)
Urobilinogen, Ur: 0.2 mg/dL (ref 0.2–1.0)
pH, UA: 8.5 — ABNORMAL HIGH (ref 5.0–7.5)

## 2022-05-21 LAB — MICROSCOPIC EXAMINATION
Bacteria, UA: NONE SEEN
RBC, Urine: NONE SEEN /hpf (ref 0–2)

## 2022-05-21 LAB — WET PREP FOR TRICH, YEAST, CLUE
Clue Cell Exam: POSITIVE — AB
Trichomonas Exam: NEGATIVE
Yeast Exam: NEGATIVE

## 2022-05-21 MED ORDER — METRONIDAZOLE 500 MG PO TABS: 500.0000 mg | ORAL_TABLET | Freq: Two times a day (BID) | ORAL | 0 refills | Status: DC

## 2022-05-21 NOTE — Progress Notes (Signed)
BP 127/68   Pulse 67   Temp 98.2 F (36.8 C) (Oral)   Ht 5\' 6"  (1.676 m)   Wt 147 lb 3.2 oz (66.8 kg)   SpO2 100%   BMI 23.76 kg/m    Subjective:    Patient ID: Levan Hurst, female    DOB: 07/13/1995, 27 y.o.   MRN: [id number]  HPI: Bethany Malone is a 27 y.o. female  Chief Complaint  Patient presents with   Vaginitis    Patient says she first noticed symptoms around Wednesday. Patient declines trying anything over the counter.    Vaginal Odor   Vaginal Dryness   VAGINAL DISCHARGE Duration: 2 days Discharge description: mucous  Pruritus: yes Dysuria: yes Malodorous: yes Urinary frequency: no Fevers: +chills Abdominal pain: no  Sexual activity: practicing safe sex History of sexually transmitted diseases: no Recent antibiotic use: no Context: worse  Treatments attempted: none  Relevant past medical, surgical, family and social history reviewed and updated as indicated. Interim medical history since our last visit reviewed. Allergies and medications reviewed and updated.  Review of Systems  Constitutional: Negative.   Respiratory: Negative.    Cardiovascular: Negative.   Gastrointestinal: Negative.   Musculoskeletal: Negative.   Neurological: Negative.   Psychiatric/Behavioral: Negative.      Per HPI unless specifically indicated above     Objective:    BP 127/68   Pulse 67   Temp 98.2 F (36.8 C) (Oral)   Ht 5\' 6"  (1.676 m)   Wt 147 lb 3.2 oz (66.8 kg)   SpO2 100%   BMI 23.76 kg/m   Wt Readings from Last 3 Encounters:  05/21/22 147 lb 3.2 oz (66.8 kg)  03/23/22 149 lb 12.8 oz (67.9 kg)  08/17/21 159 lb 3.2 oz (72.2 kg)    Physical Exam Vitals and nursing note reviewed.  Constitutional:      General: She is not in acute distress.    Appearance: Normal appearance. She is normal weight. She is not ill-appearing, toxic-appearing or diaphoretic.  HENT:     Head: Normocephalic and atraumatic.     Right Ear: External ear normal.     Left  Ear: External ear normal.     Nose: Nose normal.     Mouth/Throat:     Mouth: Mucous membranes are moist.     Pharynx: Oropharynx is clear.  Eyes:     General: No scleral icterus.       Right eye: No discharge.        Left eye: No discharge.     Extraocular Movements: Extraocular movements intact.     Conjunctiva/sclera: Conjunctivae normal.     Pupils: Pupils are equal, round, and reactive to light.  Cardiovascular:     Rate and Rhythm: Normal rate and regular rhythm.     Pulses: Normal pulses.     Heart sounds: Normal heart sounds. No murmur heard.    No friction rub. No gallop.  Pulmonary:     Effort: Pulmonary effort is normal. No respiratory distress.     Breath sounds: Normal breath sounds. No stridor. No wheezing, rhonchi or rales.  Chest:     Chest wall: No tenderness.  Musculoskeletal:        General: Normal range of motion.     Cervical back: Normal range of motion and neck supple.  Skin:    General: Skin is warm and dry.     Capillary Refill: Capillary refill takes less than 2 seconds.  Coloration: Skin is not jaundiced or pale.     Findings: No bruising, erythema, lesion or rash.  Neurological:     General: No focal deficit present.     Mental Status: She is alert and oriented to person, place, and time. Mental status is at baseline.  Psychiatric:        Mood and Affect: Mood normal.        Behavior: Behavior normal.        Thought Content: Thought content normal.        Judgment: Judgment normal.     Results for orders placed or performed in visit on 03/23/22  WET PREP FOR TRICH, YEAST, CLUE   Specimen: Urine   Urine  Result Value Ref Range   Trichomonas Exam Negative Negative   Yeast Exam Negative Negative   Clue Cell Exam Positive (A) Negative  Urinalysis, Routine w reflex microscopic  Result Value Ref Range   Specific Gravity, UA 1.025 1.005 - 1.030   pH, UA 6.5 5.0 - 7.5   Color, UA Yellow Yellow   Appearance Ur Cloudy (A) Clear    Leukocytes,UA Negative Negative   Protein,UA Negative Negative/Trace   Glucose, UA Negative Negative   Ketones, UA Negative Negative   RBC, UA Negative Negative   Bilirubin, UA Negative Negative   Urobilinogen, Ur 0.2 0.2 - 1.0 mg/dL   Nitrite, UA Negative Negative   Microscopic Examination Comment   Pregnancy, urine  Result Value Ref Range   Preg Test, Ur Negative Negative      Assessment & Plan:   Problem List Items Addressed This Visit   None Visit Diagnoses     BV (bacterial vaginosis)    -  Primary   Will treat with flagyl. Start gentle vaginal care. Call with any concerns. Continue to monitor.   Relevant Medications   metroNIDAZOLE (FLAGYL) 500 MG tablet   Other Relevant Orders   Urine Culture   Dysuria       Relevant Orders   WET PREP FOR TRICH, YEAST, CLUE   Urinalysis, Routine w reflex microscopic   Urine Culture        Follow up plan: Return if symptoms worsen or fail to improve.

## 2022-05-23 LAB — URINE CULTURE: Organism ID, Bacteria: NO GROWTH

## 2022-06-02 ENCOUNTER — Encounter: Payer: Self-pay | Admitting: Family Medicine

## 2022-06-13 ENCOUNTER — Other Ambulatory Visit: Payer: Self-pay | Admitting: Family Medicine

## 2022-06-13 MED ORDER — METRONIDAZOLE 500 MG PO TABS: 500.0000 mg | ORAL_TABLET | Freq: Two times a day (BID) | ORAL | 0 refills | Status: DC

## 2022-10-08 ENCOUNTER — Ambulatory Visit
Admission: EM | Admit: 2022-10-08 | Discharge: 2022-10-08 | Disposition: A | Discharge status: Home / Self Care | Payer: 59 | Attending: Internal Medicine | Admitting: Internal Medicine

## 2022-10-08 DIAGNOSIS — H6591 Unspecified nonsuppurative otitis media, right ear: Secondary | ICD-10-CM | POA: Diagnosis not present

## 2022-10-08 MED ORDER — PREDNISONE 20 MG PO TABS: 40.0000 mg | ORAL_TABLET | Freq: Every day | ORAL | 0 refills | Status: AC

## 2022-10-08 MED ORDER — CETIRIZINE HCL 10 MG PO TABS: 10.0000 mg | ORAL_TABLET | Freq: Every day | ORAL | 0 refills | Status: DC

## 2022-10-08 NOTE — ED Triage Notes (Signed)
Patient here today with c/o right ear pain X 6 months that has been slowly getting worse over time. Pain is now radiating to neck and jaw and she has been having some headache.

## 2022-10-08 NOTE — ED Provider Notes (Signed)
[facility]    CSN: [id number] Arrival date & time: 10/08/22  1703      History   Chief Complaint Chief Complaint  Patient presents with   Otalgia    HPI Bethany Malone is a 27 y.o. female.   Patient presents with right ear pain that radiates down her neck for the past 6 months.  Patient states that she had it evaluated by PCP when it first started and PCP stated that there was nothing wrong with the ear.  Patient states that she has not taken any medications for pain.  Denies any associated upper respiratory symptoms or fever.   Otalgia   History reviewed. No pertinent past medical history.  Patient Active Problem List   Diagnosis Date Noted   Vaginal discharge 01/19/2022   TMJ tenderness, right 08/17/2021   Depression with anxiety 05/15/2015    Past Surgical History:  Procedure Laterality Date   PLANTAR FASCIA SURGERY Right 2014    OB History     Gravida  0   Para  0   Term  0   Preterm  0   AB  0   Living  0      SAB  0   IAB  0   Ectopic  0   Multiple  0   Live Births  0            Home Medications    Prior to Admission medications   Medication Sig Start Date End Date Taking? Authorizing Provider  cetirizine (ZYRTEC) 10 MG tablet Take 1 tablet (10 mg total) by mouth daily. 10/08/22  Yes Chole Driver, Rolly Salter E, FNP  predniSONE (DELTASONE) 20 MG tablet Take 2 tablets (40 mg total) by mouth daily for 5 days. 10/08/22 10/13/22 Yes Dontasia Miranda, Acie Fredrickson, FNP  metroNIDAZOLE (FLAGYL) 500 MG tablet Take 1 tablet (500 mg total) by mouth 2 (two) times daily. Patient not taking: Reported on 10/08/2022 06/13/22   Dorcas Carrow, DO    Family History Family History  Problem Relation Age of Onset   Anxiety disorder Mother    Rheum arthritis Mother    Fibromyalgia Mother    Anxiety disorder Sister    Cancer Maternal Grandfather    Cancer Paternal Grandmother    Stroke Paternal Grandfather    Cancer Paternal Grandfather     Social  History Social History   Tobacco Use   Smoking status: Every Day    Current packs/day: 0.25    Types: Cigarettes   Smokeless tobacco: Never   Tobacco comments:    She has desire to quit and has not yet committed to quitting  Vaping Use   Vaping status: Former  Substance Use Topics   Alcohol use: Yes    Comment: on occasion   Drug use: Yes    Types: Marijuana     Allergies   Fluticasone   Review of Systems Review of Systems Per HPI  Physical Exam Triage Vital Signs ED Triage Vitals  Encounter Vitals Group     BP 10/08/22 1736 112/79     Systolic BP Percentile --      Diastolic BP Percentile --      Pulse Rate 10/08/22 1736 84     Resp 10/08/22 1736 16     Temp 10/08/22 1736 98.7 F (37.1 C)     Temp Source 10/08/22 1736 Oral     SpO2 10/08/22 1736 98 %     Weight 10/08/22 1736 125 lb (56.7 kg)  Height 10/08/22 1736 5\' 6"  (1.676 m)     Head Circumference --      Peak Flow --      Pain Score 10/08/22 1735 6     Pain Loc --      Pain Education --      Exclude from Growth Chart --    No data found.  Updated Vital Signs BP 112/79 (BP Location: Left Arm)   Pulse 84   Temp 98.7 F (37.1 C) (Oral)   Resp 16   Ht 5\' 6"  (1.676 m)   Wt 125 lb (56.7 kg)   LMP 10/03/2022 (Approximate)   SpO2 98%   BMI 20.18 kg/m   Visual Acuity Right Eye Distance:   Left Eye Distance:   Bilateral Distance:    Right Eye Near:   Left Eye Near:    Bilateral Near:     Physical Exam Constitutional:      General: She is not in acute distress.    Appearance: Normal appearance. She is not toxic-appearing or diaphoretic.  HENT:     Head: Normocephalic and atraumatic.     Right Ear: Ear canal and external ear normal. No drainage, swelling or tenderness. A middle ear effusion is present. There is no impacted cerumen. No foreign body. No mastoid tenderness. Tympanic membrane is not perforated, erythematous or bulging.  Eyes:     Extraocular Movements: Extraocular movements  intact.     Conjunctiva/sclera: Conjunctivae normal.  Pulmonary:     Effort: Pulmonary effort is normal.  Neurological:     General: No focal deficit present.     Mental Status: She is alert and oriented to person, place, and time. Mental status is at baseline.  Psychiatric:        Mood and Affect: Mood normal.        Behavior: Behavior normal.        Thought Content: Thought content normal.        Judgment: Judgment normal.      UC Treatments / Results  Labs (all labs ordered are listed, but only abnormal results are displayed) Labs Reviewed - No data to display  EKG   Radiology No results found.  Procedures Procedures (including critical care time)  Medications Ordered in UC Medications - No data to display  Initial Impression / Assessment and Plan / UC Course  I have reviewed the triage vital signs and the nursing notes.  Pertinent labs & imaging results that were available during my care of the patient were reviewed by me and considered in my medical decision making (see chart for details).     Patient has fluid behind TM.  She has an intolerance to Flonase causing headaches so will prescribe prednisone steroid and cetirizine antihistamine as this should be safe given no concern for allergic reaction.  Advised her to follow-up with PCP or urgent care if pain persists or worsens.  Patient verbalized understanding and was agreeable with plan. Final Clinical Impressions(s) / UC Diagnoses   Final diagnoses:  Fluid level behind tympanic membrane of right ear     Discharge Instructions      You have fluid behind your eardrum so I have prescribed prednisone and Zyrtec to help alleviate this.  Follow-up if any symptoms persist or worsen.    ED Prescriptions     Medication Sig Dispense Auth. Provider   cetirizine (ZYRTEC) 10 MG tablet Take 1 tablet (10 mg total) by mouth daily. 30 tablet [place], Acie Fredrickson, [place]  predniSONE (DELTASONE) 20 MG tablet Take 2 tablets (40  mg total) by mouth daily for 5 days. 10 tablet Gustavus Bryant, [place]      PDMP not reviewed this encounter.   Gustavus Bryant, [place] 10/08/22 [phone]

## 2022-10-08 NOTE — Discharge Instructions (Signed)
You have fluid behind your eardrum so I have prescribed prednisone and Zyrtec to help alleviate this.  Follow-up if any symptoms persist or worsen.

## 2022-10-14 ENCOUNTER — Ambulatory Visit: Payer: 59 | Admitting: Family Medicine

## 2023-01-08 ENCOUNTER — Ambulatory Visit
Admission: EM | Admit: 2023-01-08 | Discharge: 2023-01-08 | Disposition: A | Discharge status: Home / Self Care | Payer: 59 | Attending: Physician Assistant | Admitting: Physician Assistant

## 2023-01-08 DIAGNOSIS — N760 Acute vaginitis: Secondary | ICD-10-CM | POA: Diagnosis present

## 2023-01-08 DIAGNOSIS — N9489 Other specified conditions associated with female genital organs and menstrual cycle: Secondary | ICD-10-CM | POA: Diagnosis present

## 2023-01-08 LAB — WET PREP, GENITAL
Clue Cells Wet Prep HPF POC: NONE SEEN
Trich, Wet Prep: NONE SEEN
WBC, Wet Prep HPF POC: >=10 — AB (ref <10)
Yeast Wet Prep HPF POC: NONE SEEN

## 2023-01-08 LAB — URINALYSIS, W/ REFLEX TO CULTURE (INFECTION SUSPECTED)
Bilirubin Urine: NEGATIVE
Glucose, UA: NEGATIVE mg/dL
Hgb urine dipstick: NEGATIVE
Ketones, ur: NEGATIVE mg/dL
Nitrite: NEGATIVE
Protein, ur: NEGATIVE mg/dL
Specific Gravity, Urine: 1.025 (ref 1.005–1.030)
pH: 5.5 (ref 5.0–8.0)

## 2023-01-08 MED ORDER — METRONIDAZOLE 500 MG PO TABS: 500.0000 mg | ORAL_TABLET | Freq: Two times a day (BID) | ORAL | 0 refills | Status: AC

## 2023-01-08 NOTE — ED Provider Notes (Signed)
[facility]    CSN: [id number] Arrival date & time: 01/08/23  8413      History   Chief Complaint No chief complaint on file.   HPI Bethany Malone is a 27 y.o. female presenting for 5 day history of vaginal itching, burning, odor and slight discharge.  Patient denies burning with urination, frequency, urgency, abdominal pain or pelvic pain.  No concern for UTI.  No concern for STIs.  She reports history of BV and feels that this is pretty consistent with BV infection.  HPI  History reviewed. No pertinent past medical history.  Patient Active Problem List   Diagnosis Date Noted   Vaginal discharge 01/19/2022   TMJ tenderness, right 08/17/2021   Depression with anxiety 05/15/2015    Past Surgical History:  Procedure Laterality Date   PLANTAR FASCIA SURGERY Right 2014    OB History     Gravida  0   Para  0   Term  0   Preterm  0   AB  0   Living  0      SAB  0   IAB  0   Ectopic  0   Multiple  0   Live Births  0            Home Medications    Prior to Admission medications   Medication Sig Start Date End Date Taking? Authorizing Provider  cetirizine (ZYRTEC) 10 MG tablet Take 1 tablet (10 mg total) by mouth daily. 10/08/22   Gustavus Bryant, FNP    Family History Family History  Problem Relation Age of Onset   Anxiety disorder Mother    Rheum arthritis Mother    Fibromyalgia Mother    Anxiety disorder Sister    Cancer Maternal Grandfather    Cancer Paternal Grandmother    Stroke Paternal Grandfather    Cancer Paternal Grandfather     Social History Social History   Tobacco Use   Smoking status: Every Day    Current packs/day: 0.25    Types: Cigarettes   Smokeless tobacco: Never   Tobacco comments:    She has desire to quit and has not yet committed to quitting  Vaping Use   Vaping status: Former  Substance Use Topics   Alcohol use: Yes    Comment: on occasion   Drug use: Yes    Types: Marijuana      Allergies   Fluticasone   Review of Systems Review of Systems  Constitutional:  Negative for fatigue and fever.  Gastrointestinal:  Negative for abdominal pain.  Genitourinary:  Positive for vaginal discharge. Negative for dysuria, flank pain, frequency, hematuria, urgency, vaginal bleeding and vaginal pain.  Musculoskeletal:  Negative for back pain.  Skin:  Negative for rash.     Physical Exam Triage Vital Signs ED Triage Vitals  Encounter Vitals Group     BP --      Systolic BP Percentile --      Diastolic BP Percentile --      Pulse Rate 01/08/23 1004 87     Resp 01/08/23 1004 16     Temp 01/08/23 1004 98.3 F (36.8 C)     Temp Source 01/08/23 1004 Oral     SpO2 01/08/23 1004 100 %     Weight 01/08/23 1003 150 lb (68 kg)     Height 01/08/23 1003 5\' 6"  (1.676 m)     Head Circumference --      Peak Flow --  Pain Score 01/08/23 1001 4     Pain Loc --      Pain Education --      Exclude from Growth Chart --    No data found.  Updated Vital Signs BP (!) 120/109 (BP Location: Left Arm)   Pulse 87   Temp 98.3 F (36.8 C) (Oral)   Resp 16   Ht 5\' 6"  (1.676 m)   Wt 150 lb (68 kg)   LMP 12/16/2022   SpO2 100%   BMI 24.21 kg/m       Physical Exam Vitals and nursing note reviewed.  Constitutional:      General: She is not in acute distress.    Appearance: Normal appearance. She is not ill-appearing or toxic-appearing.  HENT:     Head: Normocephalic and atraumatic.  Eyes:     General: No scleral icterus.       Right eye: No discharge.        Left eye: No discharge.     Conjunctiva/sclera: Conjunctivae normal.  Cardiovascular:     Rate and Rhythm: Normal rate and regular rhythm.  Pulmonary:     Effort: Pulmonary effort is normal. No respiratory distress.  Musculoskeletal:     Cervical back: Neck supple.  Skin:    General: Skin is dry.  Neurological:     General: No focal deficit present.     Mental Status: She is alert. Mental status is at  baseline.     Motor: No weakness.     Gait: Gait normal.  Psychiatric:        Mood and Affect: Mood normal.        Behavior: Behavior normal.      UC Treatments / Results  Labs (all labs ordered are listed, but only abnormal results are displayed) Labs Reviewed  WET PREP, GENITAL - Abnormal; Notable for the following components:      Result Value   WBC, Wet Prep HPF POC >=10 (*)    All other components within normal limits  URINALYSIS, W/ REFLEX TO CULTURE (INFECTION SUSPECTED)    EKG   Radiology No results found.  Procedures Procedures (including critical care time)  Medications Ordered in UC Medications - No data to display  Initial Impression / Assessment and Plan / UC Course  I have reviewed the triage vital signs and the nursing notes.  Pertinent labs & imaging results that were available during my care of the patient were reviewed by me and considered in my medical decision making (see chart for details).   28 y/o female presents for 5 day history of vaginal itching, burning, odor and discharge. History of BV.   Nursing staff obtained wet prep and UA prior to provider evaluation based on patient complaints.  Patient overall well appearing. NAD.   Patient self obtained wet prep.  All negative. UA not really consistent with UTI and she does not have any urinary symptoms.  Suspect patient may have bacterial vaginosis despite negative wet prep given her history.  Will treat with metronidazole.  If no improvement after use of this medication or for any worsening symptoms she is advised to return for reevaluation and consideration of STI testing but she did not want to obtain that today as she does not have any significant concerns about possible STIs.   Final Clinical Impressions(s) / UC Diagnoses   Final diagnoses:  Acute vaginitis  Vaginal burning     Discharge Instructions      -Treat for possible  BV despite a negative test.  If you continue to have  symptoms after the next 1 week please return for reevaluation and consideration of STI testing.     ED Prescriptions   None    PDMP not reviewed this encounter.   Shirlee Latch, PA-C 01/08/23 1058

## 2023-01-08 NOTE — Discharge Instructions (Addendum)
-  Treating for possible BV despite a negative test.  If you continue to have symptoms after the next 1 week please return for reevaluation and consideration of STI testing.

## 2023-01-08 NOTE — ED Triage Notes (Signed)
Patient presents with vaginal burning, itching and odor x 5 days. No treatment used.

## 2023-01-31 ENCOUNTER — Encounter: Payer: Self-pay | Admitting: Family Medicine

## 2023-01-31 ENCOUNTER — Ambulatory Visit: Payer: Managed Care, Other (non HMO) | Admitting: Family Medicine

## 2023-01-31 VITALS — BP 139/83 | HR 87 | Wt 162.2 lb

## 2023-01-31 DIAGNOSIS — Z87898 Personal history of other specified conditions: Secondary | ICD-10-CM

## 2023-01-31 DIAGNOSIS — R0602 Shortness of breath: Secondary | ICD-10-CM | POA: Diagnosis not present

## 2023-01-31 DIAGNOSIS — Z136 Encounter for screening for cardiovascular disorders: Secondary | ICD-10-CM | POA: Diagnosis not present

## 2023-01-31 DIAGNOSIS — F410 Panic disorder [episodic paroxysmal anxiety] without agoraphobia: Secondary | ICD-10-CM | POA: Insufficient documentation

## 2023-01-31 DIAGNOSIS — Z23 Encounter for immunization: Secondary | ICD-10-CM | POA: Diagnosis not present

## 2023-01-31 LAB — URINALYSIS, ROUTINE W REFLEX MICROSCOPIC
Bilirubin, UA: NEGATIVE
Glucose, UA: NEGATIVE
Ketones, UA: NEGATIVE
Leukocytes,UA: NEGATIVE
Nitrite, UA: NEGATIVE
Protein,UA: NEGATIVE
RBC, UA: NEGATIVE
Specific Gravity, UA: 1.01 (ref 1.005–1.030)
Urobilinogen, Ur: 0.2 mg/dL (ref 0.2–1.0)
pH, UA: 7 (ref 5.0–7.5)

## 2023-01-31 MED ORDER — CITALOPRAM HYDROBROMIDE 10 MG PO TABS: 10.0000 mg | ORAL_TABLET | Freq: Every day | ORAL | 3 refills | Status: DC

## 2023-01-31 MED ORDER — CLONAZEPAM 0.5 MG PO TABS: 0.5000 mg | ORAL_TABLET | Freq: Two times a day (BID) | ORAL | 1 refills | Status: DC | PRN

## 2023-01-31 NOTE — Progress Notes (Signed)
 BP 139/83   Pulse 87   Wt 162 lb 3.2 oz (73.6 kg)   LMP 12/16/2022   SpO2 99%   BMI 26.18 kg/m    Subjective:    Patient ID: Bethany Malone, female    DOB: 1995/05/26, 28 y.o.   MRN: [id number]  HPI: Bethany Malone is a 28 y.o. female  Chief Complaint  Patient presents with   Anxiety    Patient says recently her diagnosis has become unbearable. Patient says she has noticed worrying herself into panic attacks. Patient says she recently had a life change of her boyfriend and his 53 year old moved in with her. Patient says a lot are just overwhelming her right now. Patient says she has tried to take Zoloft and Wellbutrin in the past due to side effects.   Headache   Rash    Patient says she first noticed a rash on her R side of her neck and says it comes and goes. Patient says it has been over a month and it has been here and there on both of sides.    ANXIETY/DEPRESSION- has taken zoloft and wellbutrin in the past  Duration: chronic Status:exacerbated Anxious mood: yes  Excessive worrying: yes Irritability: yes  Sweating: yes Nausea: yes Palpitations:yes Hyperventilation: yes Panic attacks: yes Agoraphobia: no  Obscessions/compulsions: yes Depressed mood: yes    01/31/2023    1:13 PM 05/21/2022   10:44 AM 03/23/2022    1:23 PM 08/17/2021    3:32 PM 04/27/2021    3:36 PM  Depression screen PHQ 2/9  Decreased Interest 1 0 0 0 0  Down, Depressed, Hopeless 1 0 0 0 0  PHQ - 2 Score 2 0 0 0 0  Altered sleeping 1 0 0 0 0  Tired, decreased energy 1 0 0 0 1  Change in appetite 0 0 0 0 2  Feeling bad or failure about yourself  1 0 0 0 1  Trouble concentrating 1 0 0 0 1  Moving slowly or fidgety/restless 0 0 0 0 0  Suicidal thoughts 0 0 0 0 0  PHQ-9 Score 6 0 0 0 5  Difficult doing work/chores  Not difficult at all Not difficult at all  Not difficult at all      01/31/2023    1:13 PM 05/21/2022   10:44 AM 03/23/2022    1:24 PM 08/17/2021    3:32 PM  GAD 7 : Generalized  Anxiety Score  Nervous, Anxious, on Edge 3 0 0 2  Control/stop worrying 3 0 0 1  Worry too much - different things 3 0 0 2  Trouble relaxing 3 0 0 1  Restless 2 0 0 1  Easily annoyed or irritable 3 0 0 2  Afraid - awful might happen 3 0 0 1  Total GAD 7 Score 20 0 0 10  Anxiety Difficulty Very difficult Not difficult at all Not difficult at all Somewhat difficult   Anhedonia: no Weight changes: yes Insomnia: no   Hypersomnia: no Fatigue/loss of energy: yes Feelings of worthlessness: yes Feelings of guilt: yes Impaired concentration/indecisiveness: yes Suicidal ideations: no  Crying spells: yes Recent Stressors/Life Changes: yes   Relationship problems: yes   Family stress: no     Financial stress: yes    Job stress: yes    Recent death/loss: no  Relevant past medical, surgical, family and social history reviewed and updated as indicated. Interim medical history since our last visit reviewed. Allergies and medications reviewed and  updated.  Review of Systems  Constitutional: Negative.   HENT:  Positive for ear pain. Negative for congestion, dental problem, drooling, ear discharge, facial swelling, hearing loss, mouth sores, nosebleeds, postnasal drip, rhinorrhea, sinus pressure, sinus pain, sneezing, sore throat, tinnitus, trouble swallowing and voice change.   Respiratory:  Positive for shortness of breath. Negative for apnea, cough, choking, chest tightness, wheezing and stridor.   Cardiovascular: Negative.   Skin:  Positive for rash. Negative for color change, pallor and wound.  Neurological:  Positive for dizziness (more room spinning). Negative for tremors, seizures, syncope, facial asymmetry, speech difficulty, weakness, light-headedness, numbness and headaches.  Psychiatric/Behavioral:  Positive for dysphoric mood and sleep disturbance. Negative for agitation, behavioral problems, confusion, decreased concentration, hallucinations, self-injury and suicidal ideas. The  patient is nervous/anxious. The patient is not hyperactive.     Per HPI unless specifically indicated above     Objective:    BP 139/83   Pulse 87   Wt 162 lb 3.2 oz (73.6 kg)   LMP 12/16/2022   SpO2 99%   BMI 26.18 kg/m   Wt Readings from Last 3 Encounters:  01/31/23 162 lb 3.2 oz (73.6 kg)  01/08/23 150 lb (68 kg)  10/08/22 125 lb (56.7 kg)    Physical Exam Vitals and nursing note reviewed.  Constitutional:      General: She is not in acute distress.    Appearance: Normal appearance. She is not ill-appearing, toxic-appearing or diaphoretic.  HENT:     Head: Normocephalic and atraumatic.     Right Ear: External ear normal.     Left Ear: External ear normal.     Nose: Nose normal.     Mouth/Throat:     Mouth: Mucous membranes are moist.     Pharynx: Oropharynx is clear.  Eyes:     General: No scleral icterus.       Right eye: No discharge.        Left eye: No discharge.     Extraocular Movements: Extraocular movements intact.     Conjunctiva/sclera: Conjunctivae normal.     Pupils: Pupils are equal, round, and reactive to light.  Cardiovascular:     Rate and Rhythm: Normal rate and regular rhythm.     Pulses: Normal pulses.     Heart sounds: Normal heart sounds. No murmur heard.    No friction rub. No gallop.  Pulmonary:     Effort: Pulmonary effort is normal. No respiratory distress.     Breath sounds: Normal breath sounds. No stridor. No wheezing, rhonchi or rales.  Chest:     Chest wall: No tenderness.  Musculoskeletal:        General: Normal range of motion.     Cervical back: Normal range of motion and neck supple.  Skin:    General: Skin is warm and dry.     Capillary Refill: Capillary refill takes less than 2 seconds.     Coloration: Skin is not jaundiced or pale.     Findings: No bruising, erythema, lesion or rash.  Neurological:     General: No focal deficit present.     Mental Status: She is alert and oriented to person, place, and time. Mental  status is at baseline.  Psychiatric:        Mood and Affect: Mood normal.        Behavior: Behavior normal.        Thought Content: Thought content normal.        Judgment: Judgment  normal.     Results for orders placed or performed during the hospital encounter of 01/08/23  Wet prep, genital   Collection Time: 01/08/23 10:11 AM   Specimen: Urine, Clean Catch  Result Value Ref Range   Yeast Wet Prep HPF POC NONE SEEN NONE SEEN   Trich, Wet Prep NONE SEEN NONE SEEN   Clue Cells Wet Prep HPF POC NONE SEEN NONE SEEN   WBC, Wet Prep HPF POC >=10 (A) <10   Sperm PRESENT   Urinalysis, w/ Reflex to Culture (Infection Suspected) -Urine, Clean Catch   Collection Time: 01/08/23 10:11 AM  Result Value Ref Range   Specimen Source URINE, CLEAN CATCH    Color, Urine YELLOW YELLOW   APPearance CLEAR CLEAR   Specific Gravity, Urine 1.025 1.005 - 1.030   pH 5.5 5.0 - 8.0   Glucose, UA NEGATIVE NEGATIVE mg/dL   Hgb urine dipstick NEGATIVE NEGATIVE   Bilirubin Urine NEGATIVE NEGATIVE   Ketones, ur NEGATIVE NEGATIVE mg/dL   Protein, ur NEGATIVE NEGATIVE mg/dL   Nitrite NEGATIVE NEGATIVE   Leukocytes,Ua TRACE (A) NEGATIVE   Squamous Epithelial / HPF 0-5 0 - 5 /HPF   WBC, UA 0-5 0 - 5 WBC/hpf   RBC / HPF 0-5 0 - 5 RBC/hpf   Bacteria, UA FEW (A) NONE SEEN   Mucus PRESENT       Assessment & Plan:   Problem List Items Addressed This Visit       Other   Panic attacks - Primary   Not doing well. Will start her on celexa  and klonopin . Will reach out counseling. Checking labs. Recheck 1-2 weeks. Call with any concerns.       Relevant Medications   citalopram  (CELEXA ) 10 MG tablet   Other Relevant Orders   CBC with Differential/Platelet   Comprehensive metabolic panel   TSH   Other Visit Diagnoses       Screening for cardiovascular condition       Labs drawn today. Await results.   Relevant Orders   Lipid Panel w/o Chol/HDL Ratio     SOB (shortness of breath)       Labs drawn  today. Await results. If all normal and continues consider spiro/CXR next visit.   Relevant Orders   VITAMIN D  25 Hydroxy (Vit-D Deficiency, Fractures)     History of dysuria       Labs drawn today. Await results.   Relevant Orders   Urinalysis, Routine w reflex microscopic     Needs flu shot       Flu shot given today.   Relevant Orders   Flu vaccine trivalent PF, 6mos and older(Flulaval,Afluria,Fluarix,Fluzone) (Completed)     Need for vaccination for pneumococcus       Prevnar shot given today.   Relevant Orders   Pneumococcal conjugate vaccine 20-valent (Prevnar 20) (Completed)        Follow up plan: Return in about 1 week (around 02/07/2023) for physical.

## 2023-01-31 NOTE — Assessment & Plan Note (Signed)
 Not doing well. Will start her on celexa and klonopin. Will reach out counseling. Checking labs. Recheck 1-2 weeks. Call with any concerns.

## 2023-02-01 ENCOUNTER — Encounter: Payer: Self-pay | Admitting: Family Medicine

## 2023-02-01 LAB — CBC WITH DIFFERENTIAL/PLATELET
Basophils Absolute: 0.1 10*3/uL (ref 0.0–0.2)
Basos: 1 %
EOS (ABSOLUTE): 0.1 10*3/uL (ref 0.0–0.4)
Eos: 1 %
Hematocrit: 41.8 % (ref 34.0–46.6)
Hemoglobin: 13.8 g/dL (ref 11.1–15.9)
Immature Grans (Abs): 0 10*3/uL (ref 0.0–0.1)
Immature Granulocytes: 0 %
Lymphocytes Absolute: 3.9 10*3/uL — ABNORMAL HIGH (ref 0.7–3.1)
Lymphs: 36 %
MCH: 29 pg (ref 26.6–33.0)
MCHC: 33 g/dL (ref 31.5–35.7)
MCV: 88 fL (ref 79–97)
Monocytes Absolute: 0.8 10*3/uL (ref 0.1–0.9)
Monocytes: 7 %
Neutrophils Absolute: 6.1 10*3/uL (ref 1.4–7.0)
Neutrophils: 55 %
Platelets: 332 10*3/uL (ref 150–450)
RBC: 4.76 x10E6/uL (ref 3.77–5.28)
RDW: 12.2 % (ref 11.7–15.4)
WBC: 11 10*3/uL — ABNORMAL HIGH (ref 3.4–10.8)

## 2023-02-01 LAB — COMPREHENSIVE METABOLIC PANEL
ALT: 25 [IU]/L (ref 0–32)
AST: 23 [IU]/L (ref 0–40)
Albumin: 5.1 g/dL — ABNORMAL HIGH (ref 4.0–5.0)
Alkaline Phosphatase: 61 [IU]/L (ref 44–121)
BUN/Creatinine Ratio: 24 — ABNORMAL HIGH (ref 9–23)
BUN: 16 mg/dL (ref 6–20)
Bilirubin Total: 0.3 mg/dL (ref 0.0–1.2)
CO2: 17 mmol/L — ABNORMAL LOW (ref 20–29)
Calcium: 10.3 mg/dL — ABNORMAL HIGH (ref 8.7–10.2)
Chloride: 104 mmol/L (ref 96–106)
Creatinine, Ser: 0.68 mg/dL (ref 0.57–1.00)
Globulin, Total: 2.3 g/dL (ref 1.5–4.5)
Glucose: 83 mg/dL (ref 70–99)
Potassium: 4.2 mmol/L (ref 3.5–5.2)
Sodium: 139 mmol/L (ref 134–144)
Total Protein: 7.4 g/dL (ref 6.0–8.5)
eGFR: 124 mL/min/{1.73_m2} (ref >59)

## 2023-02-01 LAB — VITAMIN D 25 HYDROXY (VIT D DEFICIENCY, FRACTURES): Vit D, 25-Hydroxy: 21.2 ng/mL — ABNORMAL LOW (ref 30.0–100.0)

## 2023-02-01 LAB — LIPID PANEL W/O CHOL/HDL RATIO
Cholesterol, Total: 157 mg/dL (ref 100–199)
HDL: 65 mg/dL (ref >39)
LDL Chol Calc (NIH): 81 mg/dL (ref 0–99)
Triglycerides: 55 mg/dL (ref 0–149)
VLDL Cholesterol Cal: 11 mg/dL (ref 5–40)

## 2023-02-01 LAB — TSH: TSH: 3.92 u[IU]/mL (ref 0.450–4.500)

## 2023-02-07 ENCOUNTER — Ambulatory Visit: Payer: Managed Care, Other (non HMO) | Admitting: Family Medicine

## 2023-02-07 ENCOUNTER — Other Ambulatory Visit (HOSPITAL_COMMUNITY)
Admission: RE | Admit: 2023-02-07 | Discharge: 2023-02-07 | Disposition: A | Discharge status: Home / Self Care | Payer: Managed Care, Other (non HMO) | Source: Ambulatory Visit | Attending: Family Medicine | Admitting: Family Medicine

## 2023-02-07 ENCOUNTER — Encounter: Payer: Self-pay | Admitting: Family Medicine

## 2023-02-07 VITALS — BP 123/82 | HR 84 | Temp 98.2°F | Ht 66.5 in | Wt 162.2 lb

## 2023-02-07 DIAGNOSIS — Z0000 Encounter for general adult medical examination without abnormal findings: Secondary | ICD-10-CM | POA: Insufficient documentation

## 2023-02-07 MED ORDER — TRIAMCINOLONE ACETONIDE 0.5 % EX OINT
1.0000 | TOPICAL_OINTMENT | Freq: Two times a day (BID) | CUTANEOUS | 0 refills | Status: DC
Start: 2023-02-07 — End: 2023-05-18

## 2023-02-07 NOTE — Progress Notes (Signed)
BP 123/82   Pulse 84   Temp 98.2 F (36.8 C) (Oral)   Ht 5' 6.5" (1.689 m)   Wt 162 lb 3.2 oz (73.6 kg)   LMP 01/10/2023 (Approximate)   SpO2 99%   BMI 25.79 kg/m    Subjective:    Patient ID: Bethany Malone, female    DOB: [birth year], 28 y.o.   MRN: [id number]  HPI: Bethany Malone is a 28 y.o. female presenting on 02/07/2023 for comprehensive medical examination. Current medical complaints include: feeling better with her mood already  Menopausal Symptoms: no  Depression Screen done today and results listed below:     02/07/2023   10:06 AM 01/31/2023    1:13 PM 05/21/2022   10:44 AM 03/23/2022    1:23 PM 08/17/2021    3:32 PM  Depression screen PHQ 2/9  Decreased Interest 1 1 0 0 0  Down, Depressed, Hopeless 1 1 0 0 0  PHQ - 2 Score 2 2 0 0 0  Altered sleeping 1 1 0 0 0  Tired, decreased energy 1 1 0 0 0  Change in appetite 0 0 0 0 0  Feeling bad or failure about yourself  1 1 0 0 0  Trouble concentrating 1 1 0 0 0  Moving slowly or fidgety/restless 0 0 0 0 0  Suicidal thoughts 0 0 0 0 0  PHQ-9 Score 6 6 0 0 0  Difficult doing work/chores Somewhat difficult  Not difficult at all Not difficult at all     Past Medical History:  History reviewed. No pertinent past medical history.  Surgical History:  Past Surgical History:  Procedure Laterality Date   PLANTAR FASCIA SURGERY Right 2014    Medications:  Current Outpatient Medications on File Prior to Visit  Medication Sig   citalopram (CELEXA) 10 MG tablet Take 1 tablet (10 mg total) by mouth daily.   clonazePAM (KLONOPIN) 0.5 MG tablet Take 1 tablet (0.5 mg total) by mouth 2 (two) times daily as needed for anxiety.   No current facility-administered medications on file prior to visit.    Allergies:  Allergies  Allergen Reactions   Fluticasone Other (See Comments)    Headaches    Social History:  Social History   Socioeconomic History   Marital status: Single    Spouse name: Not on file   Number of  children: Not on file   Years of education: Not on file   Highest education level: 12th grade  Occupational History   Not on file  Tobacco Use   Smoking status: Every Day    Current packs/day: 0.25    Types: Cigarettes   Smokeless tobacco: Never   Tobacco comments:    She has desire to quit and has not yet committed to quitting  Vaping Use   Vaping status: Former  Substance and Sexual Activity   Alcohol use: Yes    Comment: on occasion   Drug use: Yes    Types: Marijuana   Sexual activity: Yes  Other Topics Concern   Not on file  Social History Narrative   Not on file   Social Drivers of Health   Financial Resource Strain: Medium Risk (01/28/2023)   Overall Financial Resource Strain (CARDIA)    Difficulty of Paying Living Expenses: Somewhat hard  Food Insecurity: Food Insecurity Present (01/28/2023)   Hunger Vital Sign    Worried About Running Out of Food in the Last Year: Sometimes true    Ran Out of Food  in the Last Year: Sometimes true  Transportation Needs: No Transportation Needs (01/28/2023)   PRAPARE - Administrator, Civil Service (Medical): No    Lack of Transportation (Non-Medical): No  Physical Activity: Insufficiently Active (01/28/2023)   Exercise Vital Sign    Days of Exercise per Week: 2 days    Minutes of Exercise per Session: 20 min  Stress: Stress Concern Present (01/28/2023)   [employer] of Occupational Health - Occupational Stress Questionnaire    Feeling of Stress : Very much  Social Connections: Moderately Isolated (01/28/2023)   Social Connection and Isolation Panel [NHANES]    Frequency of Communication with Friends and Family: More than three times a week    Frequency of Social Gatherings with Friends and Family: Once a week    Attends Religious Services: Never    Database administrator or Organizations: No    Attends Engineer, structural: Not on file    Marital Status: Living with partner  Intimate Partner Violence:  Not on file   Social History   Tobacco Use  Smoking Status Every Day   Current packs/day: 0.25   Types: Cigarettes  Smokeless Tobacco Never  Tobacco Comments   She has desire to quit and has not yet committed to quitting   Social History   Substance and Sexual Activity  Alcohol Use Yes   Comment: on occasion    Family History:  Family History  Problem Relation Age of Onset   Anxiety disorder Mother    Rheum arthritis Mother    Fibromyalgia Mother    Anxiety disorder Sister    Cancer Maternal Grandfather    Cancer Paternal Grandmother    Stroke Paternal Grandfather    Cancer Paternal Grandfather     Past medical history, surgical history, medications, allergies, family history and social history reviewed with patient today and changes made to appropriate areas of the chart.   Review of Systems  Constitutional: Negative.   HENT:  Positive for ear pain. Negative for congestion, ear discharge, hearing loss, nosebleeds, sinus pain, sore throat and tinnitus.   Eyes: Negative.   Respiratory: Negative.  Negative for stridor.   Cardiovascular: Negative.   Gastrointestinal:  Positive for abdominal pain (RLQ pain). Negative for blood in stool, constipation, diarrhea, heartburn, melena, nausea and vomiting.  Genitourinary: Negative.        Occasionally burning at end of stream  Musculoskeletal: Negative.   Skin: Negative.   Neurological:  Positive for headaches. Negative for dizziness, tingling, tremors, sensory change, speech change, focal weakness, seizures, loss of consciousness and weakness.  Endo/Heme/Allergies: Negative.   Psychiatric/Behavioral:  Negative for depression, hallucinations, memory loss, substance abuse and suicidal ideas. The patient is nervous/anxious. The patient does not have insomnia.    All other ROS negative except what is listed above and in the HPI.      Objective:    BP 123/82   Pulse 84   Temp 98.2 F (36.8 C) (Oral)   Ht 5' 6.5" (1.689 m)    Wt 162 lb 3.2 oz (73.6 kg)   LMP 01/10/2023 (Approximate)   SpO2 99%   BMI 25.79 kg/m   Wt Readings from Last 3 Encounters:  02/07/23 162 lb 3.2 oz (73.6 kg)  01/31/23 162 lb 3.2 oz (73.6 kg)  01/08/23 150 lb (68 kg)    Physical Exam Vitals and nursing note reviewed. Exam conducted with a chaperone present.  Constitutional:      General: She is not  in acute distress.    Appearance: Normal appearance. She is not ill-appearing, toxic-appearing or diaphoretic.  HENT:     Head: Normocephalic and atraumatic.     Right Ear: Tympanic membrane, ear canal and external ear normal. There is no impacted cerumen.     Left Ear: Tympanic membrane, ear canal and external ear normal. There is no impacted cerumen.     Nose: Nose normal. No congestion or rhinorrhea.     Mouth/Throat:     Mouth: Mucous membranes are moist.     Pharynx: Oropharynx is clear. No oropharyngeal exudate or posterior oropharyngeal erythema.  Eyes:     General: No scleral icterus.       Right eye: No discharge.        Left eye: No discharge.     Extraocular Movements: Extraocular movements intact.     Conjunctiva/sclera: Conjunctivae normal.     Pupils: Pupils are equal, round, and reactive to light.  Neck:     Vascular: No carotid bruit.  Cardiovascular:     Rate and Rhythm: Normal rate and regular rhythm.     Pulses: Normal pulses.     Heart sounds: No murmur heard.    No friction rub. No gallop.  Pulmonary:     Effort: Pulmonary effort is normal. No respiratory distress.     Breath sounds: Normal breath sounds. No stridor. No wheezing, rhonchi or rales.  Chest:     Chest wall: No tenderness.  Breasts:    Right: Normal.     Left: Normal.  Abdominal:     General: Abdomen is flat. Bowel sounds are normal. There is no distension.     Palpations: Abdomen is soft. There is no mass.     Tenderness: There is no abdominal tenderness. There is no right CVA tenderness, left CVA tenderness, guarding or rebound.      Hernia: No hernia is present. There is no hernia in the left inguinal area or right inguinal area.  Genitourinary:    Labia:        Right: No rash, tenderness, lesion or injury.        Left: No rash, tenderness, lesion or injury.      Vagina: Vaginal discharge present.     Cervix: Normal.     Uterus: Normal.      Adnexa: Right adnexa normal and left adnexa normal.  Musculoskeletal:        General: No swelling, tenderness, deformity or signs of injury.     Cervical back: Normal range of motion and neck supple. No rigidity. No muscular tenderness.     Right lower leg: No edema.     Left lower leg: No edema.  Lymphadenopathy:     Cervical: No cervical adenopathy.  Skin:    General: Skin is warm and dry.     Capillary Refill: Capillary refill takes less than 2 seconds.     Coloration: Skin is not jaundiced or pale.     Findings: No bruising, erythema, lesion or rash.  Neurological:     General: No focal deficit present.     Mental Status: She is alert and oriented to person, place, and time. Mental status is at baseline.     Cranial Nerves: No cranial nerve deficit.     Sensory: No sensory deficit.     Motor: No weakness.     Coordination: Coordination normal.     Gait: Gait normal.     Deep Tendon Reflexes: Reflexes normal.  Psychiatric:  Mood and Affect: Mood normal.        Behavior: Behavior normal.        Thought Content: Thought content normal.        Judgment: Judgment normal.     Results for orders placed or performed in visit on 01/31/23  Urinalysis, Routine w reflex microscopic   Collection Time: 01/31/23  1:38 PM  Result Value Ref Range   Specific Gravity, UA 1.010 1.005 - 1.030   pH, UA 7.0 5.0 - 7.5   Color, UA Yellow Yellow   Appearance Ur Clear Clear   Leukocytes,UA Negative Negative   Protein,UA Negative Negative/Trace   Glucose, UA Negative Negative   Ketones, UA Negative Negative   RBC, UA Negative Negative   Bilirubin, UA Negative Negative    Urobilinogen, Ur 0.2 0.2 - 1.0 mg/dL   Nitrite, UA Negative Negative   Microscopic Examination Comment   CBC with Differential/Platelet   Collection Time: 01/31/23  1:39 PM  Result Value Ref Range   WBC 11.0 (H) 3.4 - 10.8 x10E3/uL   RBC 4.76 3.77 - 5.28 x10E6/uL   Hemoglobin 13.8 11.1 - 15.9 g/dL   Hematocrit 56.2 13.0 - 46.6 %   MCV 88 79 - 97 fL   MCH 29.0 26.6 - 33.0 pg   MCHC 33.0 31.5 - 35.7 g/dL   RDW 86.5 78.4 - 69.6 %   Platelets 332 150 - 450 x10E3/uL   Neutrophils 55 Not Estab. %   Lymphs 36 Not Estab. %   Monocytes 7 Not Estab. %   Eos 1 Not Estab. %   Basos 1 Not Estab. %   Neutrophils Absolute 6.1 1.4 - 7.0 x10E3/uL   Lymphocytes Absolute 3.9 (H) 0.7 - 3.1 x10E3/uL   Monocytes Absolute 0.8 0.1 - 0.9 x10E3/uL   EOS (ABSOLUTE) 0.1 0.0 - 0.4 x10E3/uL   Basophils Absolute 0.1 0.0 - 0.2 x10E3/uL   Immature Granulocytes 0 Not Estab. %   Immature Grans (Abs) 0.0 0.0 - 0.1 x10E3/uL  Comprehensive metabolic panel   Collection Time: 01/31/23  1:39 PM  Result Value Ref Range   Glucose 83 70 - 99 mg/dL   BUN 16 6 - 20 mg/dL   Creatinine, Ser 2.95 0.57 - 1.00 mg/dL   eGFR 284 >13 KG/MWN/0.27   BUN/Creatinine Ratio 24 (H) 9 - 23   Sodium 139 134 - 144 mmol/L   Potassium 4.2 3.5 - 5.2 mmol/L   Chloride 104 96 - 106 mmol/L   CO2 17 (L) 20 - 29 mmol/L   Calcium 10.3 (H) 8.7 - 10.2 mg/dL   Total Protein 7.4 6.0 - 8.5 g/dL   Albumin 5.1 (H) 4.0 - 5.0 g/dL   Globulin, Total 2.3 1.5 - 4.5 g/dL   Bilirubin Total 0.3 0.0 - 1.2 mg/dL   Alkaline Phosphatase 61 44 - 121 IU/L   AST 23 0 - 40 IU/L   ALT 25 0 - 32 IU/L  Lipid Panel w/o Chol/HDL Ratio   Collection Time: 01/31/23  1:39 PM  Result Value Ref Range   Cholesterol, Total 157 100 - 199 mg/dL   Triglycerides 55 0 - 149 mg/dL   HDL 65 >25 mg/dL   VLDL Cholesterol Cal 11 5 - 40 mg/dL   LDL Chol Calc (NIH) 81 0 - 99 mg/dL  TSH   Collection Time: 01/31/23  1:39 PM  Result Value Ref Range   TSH 3.920 0.450 - 4.500 uIU/mL   VITAMIN D 25 Hydroxy (Vit-D Deficiency, Fractures)  Collection Time: 01/31/23  1:39 PM  Result Value Ref Range   Vit D, 25-Hydroxy 21.2 (L) 30.0 - 100.0 ng/mL      Assessment & Plan:   Problem List Items Addressed This Visit   None Visit Diagnoses       Routine general medical examination at a health care facility    -  Primary   Vaccines up to date. Screening labs checked last visit. Pap done. Continue diet and exercise. Call with any concerns.   Relevant Orders   Cytology - PAP        Follow up plan: Return in about 4 weeks (around 03/07/2023).   LABORATORY TESTING:  - Pap smear: pap done  IMMUNIZATIONS:   - Tdap: Tetanus vaccination status reviewed: last tetanus booster within 10 years. - Influenza: Up to date - Pneumovax: Not applicable - Prevnar: Up to date - COVID: Refused - HPV: Up to date   PATIENT COUNSELING:   Advised to take 1 mg of folate supplement per day if capable of pregnancy.   Sexuality: Discussed sexually transmitted diseases, partner selection, use of condoms, avoidance of unintended pregnancy  and contraceptive alternatives.   Advised to avoid cigarette smoking.  I discussed with the patient that most people either abstain from alcohol or drink within safe limits (<=14/week and <=4 drinks/occasion for males, <=7/weeks and <= 3 drinks/occasion for females) and that the risk for alcohol disorders and other health effects rises proportionally with the number of drinks per week and how often a drinker exceeds daily limits.  Discussed cessation/primary prevention of drug use and availability of treatment for abuse.   Diet: Encouraged to adjust caloric intake to maintain  or achieve ideal body weight, to reduce intake of dietary saturated fat and total fat, to limit sodium intake by avoiding high sodium foods and not adding table salt, and to maintain adequate dietary potassium and calcium preferably from fresh fruits, vegetables, and low-fat dairy  products.    stressed the importance of regular exercise  Injury prevention: Discussed safety belts, safety helmets, smoke detector, smoking near bedding or upholstery.   Dental health: Discussed importance of regular tooth brushing, flossing, and dental visits.    NEXT PREVENTATIVE PHYSICAL DUE IN 1 YEAR. Return in about 4 weeks (around 03/07/2023).

## 2023-02-09 ENCOUNTER — Encounter: Payer: Self-pay | Admitting: Family Medicine

## 2023-02-09 ENCOUNTER — Ambulatory Visit: Payer: Self-pay

## 2023-02-09 ENCOUNTER — Ambulatory Visit
Admission: EM | Admit: 2023-02-09 | Discharge: 2023-02-09 | Disposition: A | Discharge status: Home / Self Care | Payer: Managed Care, Other (non HMO) | Attending: Family Medicine | Admitting: Family Medicine

## 2023-02-09 DIAGNOSIS — L089 Local infection of the skin and subcutaneous tissue, unspecified: Secondary | ICD-10-CM | POA: Diagnosis not present

## 2023-02-09 DIAGNOSIS — B9689 Other specified bacterial agents as the cause of diseases classified elsewhere: Secondary | ICD-10-CM

## 2023-02-09 MED ORDER — MUPIROCIN 2 % EX OINT
1.0000 | TOPICAL_OINTMENT | Freq: Two times a day (BID) | CUTANEOUS | 0 refills | Status: DC | PRN
Start: 2023-02-09 — End: 2023-05-18

## 2023-02-09 MED ORDER — CEPHALEXIN 500 MG PO CAPS: 500.0000 mg | ORAL_CAPSULE | Freq: Two times a day (BID) | ORAL | 0 refills | Status: AC

## 2023-02-09 NOTE — ED Triage Notes (Signed)
"  I have a bump that has progressively gotten worse over the past day or so". Not itchy. "Warm and swollen". Site "upper inside of left thigh". No fever.

## 2023-02-09 NOTE — ED Provider Notes (Signed)
[facility]    CSN: [id number] Arrival date & time: 02/09/23  1234      History   Chief Complaint Chief Complaint  Patient presents with   Skin Problem    HPI Bethany Malone is a 28 y.o. female.   HPI Patient presents today for evaluation of red painful circular rash on upper left thigh.  Patient reports it has been there for couple days however yesterday she noticed that the redness was expanding and she was concerned that she may be developing a staph infection.  Patient has no history of MRSA or chronic skin infections.  History reviewed. No pertinent past medical history.  Patient Active Problem List   Diagnosis Date Noted   Panic attacks 01/31/2023   TMJ tenderness, right 08/17/2021   Depression with anxiety 05/15/2015    Past Surgical History:  Procedure Laterality Date   PLANTAR FASCIA SURGERY Right 2014    OB History     Gravida  0   Para  0   Term  0   Preterm  0   AB  0   Living  0      SAB  0   IAB  0   Ectopic  0   Multiple  0   Live Births  0            Home Medications    Prior to Admission medications   Medication Sig Start Date End Date Taking? Authorizing Provider  cephALEXin (KEFLEX) 500 MG capsule Take 1 capsule (500 mg total) by mouth 2 (two) times daily for 7 days. 02/09/23 02/16/23 Yes Bing Neighbors, NP  citalopram (CELEXA) 10 MG tablet Take 1 tablet (10 mg total) by mouth daily. 01/31/23  Yes Johnson, Megan P, DO  clonazePAM (KLONOPIN) 0.5 MG tablet Take 1 tablet (0.5 mg total) by mouth 2 (two) times daily as needed for anxiety. 01/31/23  Yes Johnson, Megan P, DO  mupirocin ointment (BACTROBAN) 2 % Apply 1 Application topically 2 (two) times daily as needed (skin infection). 02/09/23  Yes Bing Neighbors, NP  triamcinolone ointment (KENALOG) 0.5 % Apply 1 Application topically 2 (two) times daily. 02/07/23   Dorcas Carrow, DO    Family History Family History  Problem Relation Age of Onset    Anxiety disorder Mother    Rheum arthritis Mother    Fibromyalgia Mother    Anxiety disorder Sister    Cancer Maternal Grandfather    Cancer Paternal Grandmother    Stroke Paternal Grandfather    Cancer Paternal Grandfather     Social History Social History   Tobacco Use   Smoking status: Every Day    Current packs/day: 0.25    Types: Cigarettes    Passive exposure: Current   Smokeless tobacco: Never   Tobacco comments:    She has desire to quit and has not yet committed to quitting  Vaping Use   Vaping status: Former  Substance Use Topics   Alcohol use: Yes    Comment: on occasion   Drug use: Not Currently    Types: Marijuana     Allergies   Fluticasone   Review of Systems Review of Systems Pertinent negatives listed in HPI   Physical Exam Triage Vital Signs ED Triage Vitals  Encounter Vitals Group     BP 02/09/23 1302 105/69     Systolic BP Percentile --      Diastolic BP Percentile --      Pulse Rate 02/09/23  1302 85     Resp 02/09/23 1302 16     Temp 02/09/23 1302 98.5 F (36.9 C)     Temp Source 02/09/23 1302 Oral     SpO2 02/09/23 1302 99 %     Weight 02/09/23 1300 160 lb (72.6 kg)     Height 02/09/23 1300 5' 6.5" (1.689 m)     Head Circumference --      Peak Flow --      Pain Score 02/09/23 1259 7     Pain Loc --      Pain Education --      Exclude from Growth Chart --    No data found.  Updated Vital Signs BP 105/69 (BP Location: Right Arm)   Pulse 85   Temp 98.5 F (36.9 C) (Oral)   Resp 16   Ht 5' 6.5" (1.689 m)   Wt 160 lb (72.6 kg)   LMP 01/10/2023 (Approximate)   SpO2 99%   BMI 25.44 kg/m   Visual Acuity Right Eye Distance:   Left Eye Distance:   Bilateral Distance:    Right Eye Near:   Left Eye Near:    Bilateral Near:     Physical Exam Vitals reviewed.  Constitutional:      Appearance: Normal appearance.  HENT:     Head: Normocephalic and atraumatic.  Eyes:     Extraocular Movements: Extraocular movements  intact.     Pupils: Pupils are equal, round, and reactive to light.  Cardiovascular:     Rate and Rhythm: Normal rate and regular rhythm.  Pulmonary:     Effort: Pulmonary effort is normal.     Breath sounds: Normal breath sounds.  Musculoskeletal:     Left lower leg: Tenderness present.       Legs:  Skin:    Capillary Refill: Capillary refill takes less than 2 seconds.  Neurological:     General: No focal deficit present.     Mental Status: She is alert.      UC Treatments / Results  Labs (all labs ordered are listed, but only abnormal results are displayed) Labs Reviewed - No data to display  EKG   Radiology No results found.  Procedures Procedures (including critical care time)  Medications Ordered in UC Medications - No data to display  Initial Impression / Assessment and Plan / UC Course  I have reviewed the triage vital signs and the nursing notes.  Pertinent labs & imaging results that were available during my care of the patient were reviewed by me and considered in my medical decision making (see chart for details).    Bacterial skin infection, left upper thigh  Apply mupirocin ointment to affected area twice daily as needed. For treatment of skin infection Keflex 500 mg twice daily for 7 days. Return precautions given if symptoms worsen or do not improve. Final Clinical Impressions(s) / UC Diagnoses   Final diagnoses:  Bacterial skin infectionm left upper thigh   Discharge Instructions   None    ED Prescriptions     Medication Sig Dispense Auth. Provider   cephALEXin (KEFLEX) 500 MG capsule Take 1 capsule (500 mg total) by mouth 2 (two) times daily for 7 days. 14 capsule Bing Neighbors, NP   mupirocin ointment (BACTROBAN) 2 % Apply 1 Application topically 2 (two) times daily as needed (skin infection). 30 g Bing Neighbors, NP      PDMP not reviewed this encounter.   Bing Neighbors, NP 02/09/23  1501  

## 2023-02-09 NOTE — ED Notes (Signed)
Provided by Patient.

## 2023-02-09 NOTE — Telephone Encounter (Signed)
  Chief Complaint: ? Bug bite Symptoms: redness and swelling  Frequency: 2 days ago Pertinent Negatives: Patient denies fever, rash elsewhere Disposition: [] ED /[x] Urgent Care (no appt availability in office) / [] Appointment(In office/virtual)/ []  [place] [facility]/ [] Home Care/ [] Refused Recommended Disposition /[] [place] [facility]/ []  Follow-up with PCP Additional Notes: no appts in office and next [facility] appt at 2: 15- pt refused all and stated she was gettnig "freaked out" going to UC. Reason for Disposition  [1] Red or very tender (to touch) area AND [2] getting larger over 48 hours after the bite  Answer Assessment - Initial Assessment Questions 1. TYPE of SPIDER: "What type of spider was it?"  (e.g., name, unknown, or brief description)     Unure if spider bit 2. LOCATION: "Where is the bite located?"      Inner left thight 3. PAIN: "Is there any pain?" If Yes, ask: "How bad is it?"  (Scale 1-10; or mild, moderate, severe)    - NONE (0): no pain    - MILD (1-3): doesn't interfere with normal activities     - MODERATE (4-7): interferes with normal activities or awakens from sleep     - SEVERE (8-10): excruciating pain, unable to do any normal activities     mild 4. SWELLING: "How big is the swelling?" (Inches, cm or compare to coins)      Yes circumferential  5. ONSET: "When did the bite occur?" (Minutes or hours ago)      2 days  7. OTHER SYMPTOMS: "Do you have any other symptoms?"  (e.g., muscle cramps, abdomen pain, change in urine color)     redness  Protocols used: Spider Bite - [employer]

## 2023-02-10 ENCOUNTER — Encounter: Payer: Self-pay | Admitting: Family Medicine

## 2023-02-10 LAB — CYTOLOGY - PAP
Adequacy: ABSENT
Diagnosis: NEGATIVE

## 2023-02-15 ENCOUNTER — Encounter: Payer: Managed Care, Other (non HMO) | Admitting: Family Medicine

## 2023-02-17 ENCOUNTER — Ambulatory Visit: Payer: Self-pay

## 2023-02-17 NOTE — Telephone Encounter (Signed)
Hold meds. Appointment please

## 2023-02-17 NOTE — Telephone Encounter (Signed)
  Chief Complaint: Medication question in pregnancy Symptoms: none Frequency:  Pertinent Negatives: Patient denies  Disposition: [] ED /[] Urgent Care (no appt availability in office) / [] Appointment(In office/virtual)/ []  [place] [facility]/ [] Home Care/ [] Refused Recommended Disposition /[] [place] [facility]/ [x]  Follow-up with PCP Additional Notes: Pt discovered that she is about [redacted] weeks pregnant yesterday. Pt was recently prescribed Celexa and klonopin. Pt is wondering if these medications are safe to take during pregnancy. Pt is also wondering if she should find an OB now or wait until she sees Dr. Laural Benes next week.  Pt would like a call back asap.    Reason for Disposition  [1] Caller has URGENT medicine question about med that PCP or specialist prescribed AND [2] triager unable to answer question  Answer Assessment - Initial Assessment Questions 1. NAME of MEDICINE: "What medicine(s) are you calling about?"     Celexa/ klonopin 2. QUESTION: "What is your question?" (e.g., double dose of medicine, side effect)     Pt is pregnant - Should she continue to takes these medications 3. PRESCRIBER: "Who prescribed the medicine?" Reason: if prescribed by specialist, call should be referred to that group.     Dr. Laural Benes 5. PREGNANCY:  "Is there any chance that you are pregnant?" "When was your last menstrual period?"     Yes - 4 weeks  Protocols used: Medication Question Call-A-AH

## 2023-02-19 DIAGNOSIS — Q969 Turner's syndrome, unspecified: Secondary | ICD-10-CM

## 2023-02-21 ENCOUNTER — Encounter: Payer: Self-pay | Admitting: Family Medicine

## 2023-02-21 ENCOUNTER — Ambulatory Visit: Payer: Managed Care, Other (non HMO) | Admitting: Family Medicine

## 2023-02-21 VITALS — BP 111/66 | HR 101 | Ht 66.5 in | Wt 162.0 lb

## 2023-02-21 DIAGNOSIS — N912 Amenorrhea, unspecified: Secondary | ICD-10-CM

## 2023-02-21 DIAGNOSIS — F410 Panic disorder [episodic paroxysmal anxiety] without agoraphobia: Secondary | ICD-10-CM

## 2023-02-21 DIAGNOSIS — Z3A01 Less than 8 weeks gestation of pregnancy: Secondary | ICD-10-CM

## 2023-02-21 LAB — PREGNANCY, URINE: Preg Test, Ur: POSITIVE — AB

## 2023-02-21 NOTE — Assessment & Plan Note (Signed)
Better since finding out she's pregnant. Not taking any medicine. Continue to monitor. Call with any concerns.

## 2023-02-21 NOTE — Progress Notes (Signed)
BP 111/66   Pulse (!) 101   Ht 5' 6.5" (1.689 m)   Wt 162 lb (73.5 kg)   LMP 01/06/2023 (Exact Date)   SpO2 98%   BMI 25.76 kg/m    Subjective:    Patient ID: Bethany Malone, female    DOB: 1995-07-15, 28 y.o.   MRN: [id number]  HPI: Bethany Malone is a 28 y.o. female  Chief Complaint  Patient presents with   Pregnancy Questions    Patient is here to discuss medications she was prescribed for mood, but patient has since tested positive for pregnancy about 4 weeks. Patient would like to discuss safety of medications during pregnancy.    PREGNANCY DIAGNOSIS Gravida/Para: G1P0 Home pregnancy test: Positive x2 Nausea: yes Vomiting: no Breast tenderness: yes Abdominal pain: yes- RLQ, a little bit, was going on before she was pregnant Vaginal bleeding: no Pregnancy or labor complications: no Fetal abnormalities: no  ANXIETY/STRESS Duration: chronic Status:better Anxious mood: no  Excessive worrying: no Irritability: no  Sweating: no Nausea: no Palpitations:no Hyperventilation: no Panic attacks: no Agoraphobia: no  Obscessions/compulsions: no Depressed mood: no    02/07/2023   10:06 AM 01/31/2023    1:13 PM 05/21/2022   10:44 AM 03/23/2022    1:23 PM 08/17/2021    3:32 PM  Depression screen PHQ 2/9  Decreased Interest 1 1 0 0 0  Down, Depressed, Hopeless 1 1 0 0 0  PHQ - 2 Score 2 2 0 0 0  Altered sleeping 1 1 0 0 0  Tired, decreased energy 1 1 0 0 0  Change in appetite 0 0 0 0 0  Feeling bad or failure about yourself  1 1 0 0 0  Trouble concentrating 1 1 0 0 0  Moving slowly or fidgety/restless 0 0 0 0 0  Suicidal thoughts 0 0 0 0 0  PHQ-9 Score 6 6 0 0 0  Difficult doing work/chores Somewhat difficult  Not difficult at all Not difficult at all    Anhedonia: no Weight changes: no Insomnia: no   Hypersomnia: no Fatigue/loss of energy: no Feelings of worthlessness: no Feelings of guilt: no Impaired concentration/indecisiveness: no Suicidal ideations: no   Crying spells: no Recent Stressors/Life Changes: no   Relationship problems: no   Family stress: no     Financial stress: no    Job stress: no    Recent death/loss: no   Relevant past medical, surgical, family and social history reviewed and updated as indicated. Interim medical history since our last visit reviewed. Allergies and medications reviewed and updated.  Review of Systems  Constitutional: Negative.   Respiratory: Negative.    Cardiovascular: Negative.   Musculoskeletal: Negative.   Psychiatric/Behavioral: Negative.      Per HPI unless specifically indicated above     Objective:    BP 111/66   Pulse (!) 101   Ht 5' 6.5" (1.689 m)   Wt 162 lb (73.5 kg)   LMP 01/06/2023 (Exact Date)   SpO2 98%   BMI 25.76 kg/m   Wt Readings from Last 3 Encounters:  02/21/23 162 lb (73.5 kg)  02/09/23 160 lb (72.6 kg)  02/07/23 162 lb 3.2 oz (73.6 kg)    Physical Exam Vitals and nursing note reviewed.  Constitutional:      General: She is not in acute distress.    Appearance: Normal appearance. She is not ill-appearing, toxic-appearing or diaphoretic.  HENT:     Head: Normocephalic and atraumatic.  Right Ear: External ear normal.     Left Ear: External ear normal.     Nose: Nose normal.     Mouth/Throat:     Mouth: Mucous membranes are moist.     Pharynx: Oropharynx is clear.  Eyes:     General: No scleral icterus.       Right eye: No discharge.        Left eye: No discharge.     Extraocular Movements: Extraocular movements intact.     Conjunctiva/sclera: Conjunctivae normal.     Pupils: Pupils are equal, round, and reactive to light.  Cardiovascular:     Rate and Rhythm: Normal rate and regular rhythm.     Pulses: Normal pulses.     Heart sounds: Normal heart sounds. No murmur heard.    No friction rub. No gallop.  Pulmonary:     Effort: Pulmonary effort is normal. No respiratory distress.     Breath sounds: Normal breath sounds. No stridor. No wheezing,  rhonchi or rales.  Chest:     Chest wall: No tenderness.  Musculoskeletal:        General: Normal range of motion.     Cervical back: Normal range of motion and neck supple.  Skin:    General: Skin is warm and dry.     Capillary Refill: Capillary refill takes less than 2 seconds.     Coloration: Skin is not jaundiced or pale.     Findings: No bruising, erythema, lesion or rash.  Neurological:     General: No focal deficit present.     Mental Status: She is alert and oriented to person, place, and time. Mental status is at baseline.  Psychiatric:        Mood and Affect: Mood normal.        Behavior: Behavior normal.        Thought Content: Thought content normal.        Judgment: Judgment normal.     Results for orders placed or performed in visit on 02/07/23  Cytology - PAP   Collection Time: 02/07/23 10:13 AM  Result Value Ref Range   Adequacy      Satisfactory for evaluation; transformation zone component ABSENT.   Diagnosis      - Negative for intraepithelial lesion or malignancy (NILM)      Assessment & Plan:   Problem List Items Addressed This Visit       Other   Panic attacks   Better since finding out she's pregnant. Not taking any medicine. Continue to monitor. Call with any concerns.       Other Visit Diagnoses       Less than [redacted] weeks gestation of pregnancy    -  Primary   Encouraged her to start prenatal. Referral to OBGYN placed today. Call with any concerns.   Relevant Orders   Ambulatory referral to Obstetrics / Gynecology     Amenorrhea       + pregnancy   Relevant Orders   Pregnancy, urine        Follow up plan: Return January 2026, for physical.

## 2023-02-22 ENCOUNTER — Ambulatory Visit: Payer: Managed Care, Other (non HMO) | Admitting: Pediatrics

## 2023-02-25 ENCOUNTER — Ambulatory Visit: Payer: Managed Care, Other (non HMO) | Admitting: Family Medicine

## 2023-03-07 ENCOUNTER — Ambulatory Visit: Payer: Self-pay | Admitting: Family Medicine

## 2023-03-16 DIAGNOSIS — Z7185 Encounter for immunization safety counseling: Secondary | ICD-10-CM | POA: Insufficient documentation

## 2023-03-16 DIAGNOSIS — G44029 Chronic cluster headache, not intractable: Secondary | ICD-10-CM | POA: Insufficient documentation

## 2023-03-16 DIAGNOSIS — Z3491 Encounter for supervision of normal pregnancy, unspecified, first trimester: Secondary | ICD-10-CM | POA: Insufficient documentation

## 2023-03-25 DIAGNOSIS — Z315 Encounter for genetic counseling: Secondary | ICD-10-CM | POA: Insufficient documentation

## 2023-03-25 HISTORY — DX: Encounter for procreative genetic counseling: Z31.5

## 2023-03-25 LAB — PANORAMA PRENATAL TEST FULL PANEL:PANORAMA TEST PLUS 5 ADDITIONAL MICRODELETIONS
FETAL FRACTION: 6.9
MONOSOMY X RESULT TEXT: HIGH — AB
REPORT SUMMARY: HIGH — AB

## 2023-03-29 DIAGNOSIS — O039 Complete or unspecified spontaneous abortion without complication: Secondary | ICD-10-CM

## 2023-03-29 DIAGNOSIS — O021 Missed abortion: Secondary | ICD-10-CM

## 2023-03-29 HISTORY — DX: Complete or unspecified spontaneous abortion without complication: O03.9

## 2023-03-29 HISTORY — DX: Missed abortion: O02.1

## 2023-04-04 HISTORY — PX: DILATION AND CURETTAGE, DIAGNOSTIC / THERAPEUTIC: SUR384

## 2023-05-12 ENCOUNTER — Emergency Department
Admission: EM | Admit: 2023-05-12 | Discharge: 2023-05-12 | Disposition: A | Discharge status: Home / Self Care | Attending: Emergency Medicine | Admitting: Emergency Medicine

## 2023-05-12 ENCOUNTER — Encounter: Payer: Self-pay | Admitting: *Deleted

## 2023-05-12 ENCOUNTER — Other Ambulatory Visit: Payer: Self-pay

## 2023-05-12 DIAGNOSIS — H81391 Other peripheral vertigo, right ear: Secondary | ICD-10-CM | POA: Diagnosis not present

## 2023-05-12 DIAGNOSIS — R519 Headache, unspecified: Secondary | ICD-10-CM | POA: Diagnosis present

## 2023-05-12 DIAGNOSIS — R42 Dizziness and giddiness: Secondary | ICD-10-CM

## 2023-05-12 DIAGNOSIS — M26621 Arthralgia of right temporomandibular joint: Secondary | ICD-10-CM | POA: Diagnosis not present

## 2023-05-12 DIAGNOSIS — H81399 Other peripheral vertigo, unspecified ear: Secondary | ICD-10-CM

## 2023-05-12 MED ORDER — MECLIZINE HCL 12.5 MG PO TABS
12.5000 mg | ORAL_TABLET | Freq: Three times a day (TID) | ORAL | 0 refills | Status: DC | PRN
Start: 2023-05-12 — End: 2023-06-27

## 2023-05-12 MED ORDER — IBUPROFEN 600 MG PO TABS: 600.0000 mg | ORAL_TABLET | Freq: Four times a day (QID) | ORAL | 0 refills | Status: DC | PRN

## 2023-05-12 NOTE — ED Provider Notes (Signed)
 [facility] Provider Note   CSN: [id number] Arrival date & time: 05/12/23  1832      Chief Complaint  Patient presents with   Facial Pain   Torticollis    Bethany Malone is a 28 y.o. female.  With no past medical history on no medications presents to the emergency department for evaluation of of right-sided facial pain and dizziness.  The symptoms have been intermittently for the last 2 to 3 months.  She currently is asymptomatic.  She has had more frequent episodes over the last 2 to 3 months which is why she came in today.  She denies any trauma or injury.  No fevers chills.  No numbness or tingling.  No weakness or neurological deficits.  She has not take any medications for symptoms.  HPI     Home Medications Prior to Admission medications   Medication Sig Start Date End Date Taking? Authorizing Provider  ibuprofen  (ADVIL ) 600 MG tablet Take 1 tablet (600 mg total) by mouth every 6 (six) hours as needed for moderate pain (pain score 4-6). 05/12/23  Yes Coralyn Derry, PA-C  meclizine  (ANTIVERT ) 12.5 MG tablet Take 1-2 tablets (12.5-25 mg total) by mouth 3 (three) times daily as needed for dizziness. 05/12/23  Yes Coralyn Derry, PA-C  mupirocin  ointment (BACTROBAN ) 2 % Apply 1 Application topically 2 (two) times daily as needed (skin infection). 02/09/23   Buena Carmine, NP  triamcinolone  ointment (KENALOG ) 0.5 % Apply 1 Application topically 2 (two) times daily. 02/07/23   Terre Ferri P, DO      Allergies    Fluticasone    Review of Systems   Review of Systems  Physical Exam Updated Vital Signs BP 127/78 (BP Location: Right Arm)   Pulse 85   Temp 98.4 F (36.9 C) (Oral)   Resp 18   Ht 5\' 6"  (1.676 m)   Wt 72.6 kg   LMP 05/12/2023 (Exact Date)   SpO2 100%   Breastfeeding Unknown   BMI 25.82 kg/m  Physical Exam Constitutional:      Appearance: She is well-developed.  HENT:     Head: Normocephalic and  atraumatic.     Comments: Positive TMJ tenderness on the right.  No trismus noted.  No palpable catching clicking or locking of the right TMJ.  No swelling noted.    Ears:     Comments: Fluid noted behind both TMs, right ear canal 70% blocked with cerumen.  Left ear canal normal.  No signs of any infectious process.    Mouth/Throat:     Pharynx: Oropharynx is clear. No oropharyngeal exudate or posterior oropharyngeal erythema.  Eyes:     Extraocular Movements: Extraocular movements intact.     Conjunctiva/sclera: Conjunctivae normal.     Pupils: Pupils are equal, round, and reactive to light.  Cardiovascular:     Rate and Rhythm: Normal rate.  Pulmonary:     Effort: Pulmonary effort is normal. No respiratory distress.  Musculoskeletal:        General: Normal range of motion.     Cervical back: Normal range of motion.  Skin:    General: Skin is warm.     Findings: No rash.  Neurological:     General: No focal deficit present.     Mental Status: She is alert and oriented to person, place, and time. Mental status is at baseline.     Cranial Nerves: No cranial nerve deficit.  Motor: No weakness.     Coordination: Coordination normal.     Gait: Gait normal.     Deep Tendon Reflexes: Reflexes normal.     Comments: Patient asymptomatic but with head rotation horizontally she does develop some slight dizziness with mild nystagmus noted.  Her neuroexam is normal.  Cranial nerves II through XII intact.  Psychiatric:        Behavior: Behavior normal.        Thought Content: Thought content normal.     ED Results / Procedures / Treatments   Labs (all labs ordered are listed, but only abnormal results are displayed) Labs Reviewed - No data to display  EKG None  Radiology No results found.  Procedures Procedures    Medications Ordered in ED Medications - No data to display  ED Course/ Medical Decision Making/ A&P                                 Medical Decision  Making Risk Prescription drug management.   28 year old female with reports of right sided facial pain, headache, dizziness.  Her symptoms come and go.  She is currently asymptomatic.  No past medical history.  She does report a history of TMJ discomfort, currently has some TMJ tenderness.  cranial nerves II through XII intact.  She does have some mild vertigo symptoms with head rotation, this is only time she seems to be symptomatic.  She also has some mild nystagmus.  Her symptoms are consistent with peripheral vertigo.  Will place on meclizine  to take as needed.  Encouraged to take ibuprofen  for possible TMJ symptoms.   we discussed obtaining imaging but felt as if not necessary due to normal neuroexam and patient being asymptomatic.  Recommend outpatient follow-up with ENT.  Patient understands signs symptoms return to the ED for. Final Clinical Impression(s) / ED Diagnoses Final diagnoses:  Dizziness  Arthralgia of right temporomandibular joint  Acute nonintractable headache, unspecified headache type  Peripheral vertigo, unspecified laterality    Rx / DC Orders ED Discharge Orders          Ordered    meclizine  (ANTIVERT ) 12.5 MG tablet  3 times daily PRN        05/12/23 2205    ibuprofen  (ADVIL ) 600 MG tablet  Every 6 hours PRN        05/12/23 2205              Coralyn Derry, PA-C 05/12/23 2214    Marylynn Soho, MD 05/12/23 2248

## 2023-05-12 NOTE — Discharge Instructions (Addendum)
 Please take meclizine  as prescribed as needed for symptoms.  You may also use ibuprofen  as needed for right TMJ pain and right-sided headache.  Call ENT tomorrow to schedule follow-up appointment.  Return to the ER for any severe pain, nausea vomiting, dizziness, weakness that is not responding to medications.

## 2023-05-12 NOTE — ED Notes (Signed)
 ..  The patient is A&OX4, ambulatory at d/c with independent steady gait, NAD. Pt verbalized understanding of d/c instructions, prescription and follow up care.

## 2023-05-12 NOTE — ED Triage Notes (Signed)
 Pt ambulatory to triage.  Pt has pain in right side of face around the right ear and behind the ear going into the neck.  Pt denies injury.  Pt has had sx for 2 months. No headache.  No earache.    Speech clear.  Pt alert.

## 2023-05-13 ENCOUNTER — Ambulatory Visit: Payer: Self-pay

## 2023-05-13 NOTE — Telephone Encounter (Signed)
  Chief Complaint: Dizziness Symptoms: dizzy Frequency: intermittent Pertinent Negatives: Patient denies chest pain, headache, vision changes, tingling, numbness, weakness Disposition: [] ED /[] Urgent Care (no appt availability in office) / [x] Appointment(In office/virtual)/ []  [facility]/ [] Home Care/ [] Refused Recommended Disposition /[] [place] [facility]/ []  Follow-up with PCP Additional Notes:   Pressure above right ear and dizziness, she went to ER last night, was advised neurologically she is fine, they advised CT if she would like but she declined due to how late it was. They gave her a prescription for "vertigo", she feels this is something more than vertigo, feels something is wrong. Not currently dizzy. She has appointment scheduled June 3rd but would like to come in sooner. Hospital follow up scheduled 05/16/23 with alternate provider. FYI plans to inquire about outpatient imaging during this visit.   Message from [place] B sent at 05/13/2023 10:22 AM EDT  Copied From CRM [id number]. Reason for Triage: pt has dizziness   Reason for Disposition  [1] MILD dizziness (e.g., walking normally) AND [2] has been evaluated by doctor (or NP/PA) for this  Protocols used: Dizziness - Lightheadedness-A-AH

## 2023-05-16 ENCOUNTER — Inpatient Hospital Stay: Admitting: Nurse Practitioner

## 2023-05-18 ENCOUNTER — Encounter: Payer: Self-pay | Admitting: Family Medicine

## 2023-05-18 ENCOUNTER — Ambulatory Visit: Admitting: Family Medicine

## 2023-05-18 VITALS — BP 122/72 | HR 80 | Ht 66.0 in | Wt 160.0 lb

## 2023-05-18 DIAGNOSIS — H6121 Impacted cerumen, right ear: Secondary | ICD-10-CM

## 2023-05-18 DIAGNOSIS — G43E09 Chronic migraine with aura, not intractable, without status migrainosus: Secondary | ICD-10-CM | POA: Diagnosis not present

## 2023-05-18 MED ORDER — NURTEC 75 MG PO TBDP: 75.0000 mg | ORAL_TABLET | Freq: Every day | ORAL | 12 refills | Status: DC | PRN

## 2023-05-18 MED ORDER — NURTEC 75 MG PO TBDP: 75.0000 mg | ORAL_TABLET | Freq: Every day | ORAL | Status: DC | PRN

## 2023-05-18 NOTE — Assessment & Plan Note (Signed)
 In exacerbation. Will start nurtec. If not better by Friday will get her set up for MRI w and wo due to dizziness. Call with any concerns. Continue to monitor.

## 2023-05-18 NOTE — Progress Notes (Signed)
 BP 122/72 (BP Location: Left Arm, Patient Position: Sitting, Cuff Size: Normal)   Pulse 80   Ht 5\' 6"  (1.676 m)   Wt 160 lb (72.6 kg)   LMP 05/12/2023 (Exact Date)   SpO2 100%   BMI 25.82 kg/m    Subjective:    Patient ID: Bethany Malone, female    DOB: [birth year], 28 y.o.   MRN: [id number]  HPI: Bethany Malone is a 28 y.o. female  Chief Complaint  Patient presents with   Migraine    Pt states that migraines are worsening. Recently had one for 4 days straight.    Follow-up    ED f/u 4/24   Dizziness   Unfortunately, Bethany Malone suffered a miscarriage at 9 weeks 1 day. She has been following with [facility].   ER FOLLOW UP Time since discharge: 6 days Hospital/facility: [facility] Diagnosis: Dizziness, headache Procedures/tests: None Consultants: None New medications: meclizine  Discharge instructions:  Follow up here and with ENT Status: stable  MIGRAINES Duration: chronic and worse in the past couple of months Onset: sudden Severity: severe Quality: pressure and burning Frequency: nearly every day Location: R temple Headache duration: nearly constant Radiation: no Headache status at time of visit: current headache Treatments attempted: Tylenol and ibuprofen , imitrex as a kid Aura: no Nausea:  no Vomiting: no Photophobia:  no Phonophobia:  no Effect on social functioning:  yes Confusion:  yes- very slight Gait disturbance/ataxia:  yes Behavioral changes:  no Fevers:  no  Relevant past medical, surgical, family and social history reviewed and updated as indicated. Interim medical history since our last visit reviewed. Allergies and medications reviewed and updated.  Review of Systems  Constitutional: Negative.   Respiratory: Negative.    Cardiovascular: Negative.   Gastrointestinal: Negative.   Musculoskeletal: Negative.   Neurological:  Positive for dizziness and headaches. Negative for tremors, seizures, syncope, facial asymmetry, speech difficulty, weakness,  light-headedness and numbness.  Psychiatric/Behavioral: Negative.      Per HPI unless specifically indicated above     Objective:    BP 122/72 (BP Location: Left Arm, Patient Position: Sitting, Cuff Size: Normal)   Pulse 80   Ht 5\' 6"  (1.676 m)   Wt 160 lb (72.6 kg)   LMP 05/12/2023 (Exact Date)   SpO2 100%   BMI 25.82 kg/m   Wt Readings from Last 3 Encounters:  05/18/23 160 lb (72.6 kg)  05/12/23 160 lb (72.6 kg)  02/21/23 162 lb (73.5 kg)    Physical Exam Vitals and nursing note reviewed.  Constitutional:      General: She is not in acute distress.    Appearance: Normal appearance. She is normal weight. She is not ill-appearing, toxic-appearing or diaphoretic.  HENT:     Head: Normocephalic and atraumatic.     Right Ear: Tympanic membrane and external ear normal. There is impacted cerumen.     Left Ear: Tympanic membrane, ear canal and external ear normal.     Nose: Nose normal. No congestion or rhinorrhea.     Mouth/Throat:     Mouth: Mucous membranes are moist.     Pharynx: Oropharynx is clear. No oropharyngeal exudate or posterior oropharyngeal erythema.  Eyes:     General: No scleral icterus.       Right eye: No discharge.        Left eye: No discharge.     Extraocular Movements: Extraocular movements intact.     Conjunctiva/sclera: Conjunctivae normal.     Pupils: Pupils are equal, round, and  reactive to light.  Cardiovascular:     Rate and Rhythm: Normal rate and regular rhythm.     Pulses: Normal pulses.     Heart sounds: Normal heart sounds. No murmur heard.    No friction rub. No gallop.  Pulmonary:     Effort: Pulmonary effort is normal. No respiratory distress.     Breath sounds: Normal breath sounds. No stridor. No wheezing, rhonchi or rales.  Chest:     Chest wall: No tenderness.  Musculoskeletal:        General: Normal range of motion.     Cervical back: Normal range of motion and neck supple.  Skin:    General: Skin is warm and dry.      Capillary Refill: Capillary refill takes less than 2 seconds.     Coloration: Skin is not jaundiced or pale.     Findings: No bruising, erythema, lesion or rash.  Neurological:     General: No focal deficit present.     Mental Status: She is alert and oriented to person, place, and time. Mental status is at baseline.  Psychiatric:        Mood and Affect: Mood normal.        Behavior: Behavior normal.        Thought Content: Thought content normal.        Judgment: Judgment normal.     Results for orders placed or performed in visit on 02/21/23  Pregnancy, urine   Collection Time: 02/21/23  8:52 AM  Result Value Ref Range   Preg Test, Ur Positive (A) Negative      Assessment & Plan:   Problem List Items Addressed This Visit       Cardiovascular and Mediastinum   Chronic migraine with aura without status migrainosus, not intractable - Primary   In exacerbation. Will start nurtec. If not better by Friday will get her set up for MRI w and wo due to dizziness. Call with any concerns. Continue to monitor.      Relevant Medications   Rimegepant Sulfate (NURTEC) 75 MG TBDP   Rimegepant Sulfate (NURTEC) 75 MG TBDP   Other Visit Diagnoses       Hearing loss due to cerumen impaction, right       Ear flushed today with good results.        Follow up plan: Return in about 4 weeks (around 06/15/2023).  >25 minutes spent with patient and in chart review today

## 2023-05-20 ENCOUNTER — Encounter: Payer: Self-pay | Admitting: Family Medicine

## 2023-05-20 DIAGNOSIS — G43E09 Chronic migraine with aura, not intractable, without status migrainosus: Secondary | ICD-10-CM

## 2023-05-20 DIAGNOSIS — R42 Dizziness and giddiness: Secondary | ICD-10-CM

## 2023-05-22 ENCOUNTER — Other Ambulatory Visit: Payer: Self-pay

## 2023-05-22 ENCOUNTER — Emergency Department (HOSPITAL_COMMUNITY)
Admission: EM | Admit: 2023-05-22 | Discharge: 2023-05-22 | Disposition: A | Discharge status: Home / Self Care | Attending: Emergency Medicine | Admitting: Emergency Medicine

## 2023-05-22 ENCOUNTER — Emergency Department (HOSPITAL_COMMUNITY)

## 2023-05-22 ENCOUNTER — Encounter (HOSPITAL_COMMUNITY): Payer: Self-pay

## 2023-05-22 DIAGNOSIS — S0083XA Contusion of other part of head, initial encounter: Secondary | ICD-10-CM | POA: Diagnosis not present

## 2023-05-22 DIAGNOSIS — Y92410 Unspecified street and highway as the place of occurrence of the external cause: Secondary | ICD-10-CM | POA: Diagnosis not present

## 2023-05-22 DIAGNOSIS — S0990XA Unspecified injury of head, initial encounter: Secondary | ICD-10-CM | POA: Diagnosis present
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
## 2023-05-25 ENCOUNTER — Ambulatory Visit: Payer: Self-pay
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
## 2023-05-27 ENCOUNTER — Ambulatory Visit: Admission: EM | Admit: 2023-05-27 | Discharge: 2023-05-27 | Disposition: A | Discharge status: Home / Self Care

## 2023-05-27 ENCOUNTER — Telehealth: Payer: Self-pay

## 2023-05-27 DIAGNOSIS — G44319 Acute post-traumatic headache, not intractable: Secondary | ICD-10-CM

## 2023-05-27 DIAGNOSIS — G43709 Chronic migraine without aura, not intractable, without status migrainosus: Secondary | ICD-10-CM | POA: Diagnosis not present

## 2023-05-27 NOTE — Telephone Encounter (Signed)
 Copied from CRM [id number]. Topic: Clinical - Request for Lab/Test Order >> May 26, 2023  3:26 PM Hamp Levine R wrote: Reason for CRM: Odilia Bennett from the outreach program with [employer] called to request that the order for the patients MRI Brain with contrast order be faxed to [facility] at [phone].  Odilia Bennett can be reached at [phone] for additional information.
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
## 2023-05-27 NOTE — Discharge Instructions (Signed)
-   Take ibuprofen  and Tylenol as well as the Nurtec as needed for headaches. - Apply ice to the area of pain. - Try to avoid use of electronics. - You may have a mild concussion. -Hopefully you can get your MRI soon. - If at any point you feel like the headache is worsening, you have vision changes, confusion, dizziness, vomiting
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
## 2023-05-27 NOTE — Telephone Encounter (Signed)
 Copied from CRM [id number]. Topic: General - Other >> May 26, 2023 12:43 PM Bethany Malone wrote: Reason for CRM: Received call from [facility] per Alisia Irons. Ph: [phone] needs MRI of brain, MR Brain W Wo Contrast (Order [id number]) and CT Head Wo Contrast (Accession [id number]) (Order [id number]), to fax: [phone] to [facility] ,

## 2023-05-31 ENCOUNTER — Telehealth: Payer: Self-pay

## 2023-05-31 NOTE — Telephone Encounter (Unsigned)
 Copied from CRM [id number]. Topic: Medical Record Request - Provider/Facility Request >> May 30, 2023  1:38 PM Bethany Malone wrote: Reason for CRM: Asking for MRI orders be sent to [facility]//Faxed to [facility] at [phone]

## 2023-06-01 ENCOUNTER — Inpatient Hospital Stay: Admitting: Nurse Practitioner

## 2023-06-01 ENCOUNTER — Telehealth: Payer: Self-pay | Admitting: Family Medicine

## 2023-06-01 NOTE — Progress Notes (Deleted)
   LMP 05/08/2023 (Exact Date)    Subjective:    Patient ID: Bethany Malone, female    DOB: 1995-09-08, 28 y.o.   MRN: [id number]  HPI: Bethany Malone is a 28 y.o. female  No chief complaint on file.   Relevant past medical, surgical, family and social history reviewed and updated as indicated. Interim medical history since our last visit reviewed. Allergies and medications reviewed and updated.  Review of Systems  Per HPI unless specifically indicated above     Objective:     LMP 05/08/2023 (Exact Date)   Wt Readings from Last 3 Encounters:  05/27/23 160 lb (72.6 kg)  05/22/23 155 lb (70.3 kg)  05/18/23 160 lb (72.6 kg)    Physical Exam  Results for orders placed or performed in visit on 02/21/23  Pregnancy, urine   Collection Time: 02/21/23  8:52 AM  Result Value Ref Range   Preg Test, Ur Positive (A) Negative      Assessment & Plan:   Problem List Items Addressed This Visit   None    Follow up plan: No follow-ups on file.

## 2023-06-01 NOTE — Telephone Encounter (Signed)
Routed to clinical

## 2023-06-01 NOTE — Telephone Encounter (Signed)
 Copied from CRM [id number]. Topic: Referral - Question >> May 31, 2023 12:56 PM DeAngela L wrote: Reason for CRM: [facility] at  number [phone] fax number [phone]  waiting for the referral /signature to be sent over so the patient can get scheduled for the appt   Odilia Bennett calling with [facility] department call back number  [phone] to inform office that [facility] needs additional info before they could get pt scheduled

## 2023-06-02 ENCOUNTER — Telehealth: Payer: Self-pay | Admitting: Family Medicine

## 2023-06-02 NOTE — Telephone Encounter (Signed)
 Copied from CRM [id number]. Topic: Referral - Request for Referral >> Jun 01, 2023  2:12 PM Rosaria Common wrote: Did the patient discuss referral with their provider in the last year? Yes (If No - schedule appointment) (If Yes - send message)  Appointment offered? Yes  Type of order/referral and detailed reason for visit: MRI  Preference of office, provider, location: [facility]  If referral order, have you been seen by this specialty before? No (If Yes, this issue or another issue? When? Where?  Can we respond through MyChart? Yes  Odilia Bennett from [facility] calling to request MRI referral be faxed to clinic at [phone]

## 2023-06-03 NOTE — Telephone Encounter (Signed)
 Pending provider signature of paper order before faxing.

## 2023-06-03 NOTE — Telephone Encounter (Signed)
 Placed in providers box for signature and will be faxed when able.

## 2023-06-16 ENCOUNTER — Telehealth: Payer: Self-pay | Admitting: Family Medicine

## 2023-06-16 NOTE — Telephone Encounter (Signed)
 Copied from CRM [id number]. Topic: General - Other >> Jun 08, 2023  3:17 PM Essie A wrote: Reason for CRM: Bethany Malone from [facility] calling to request status of the MRI referral be faxed to clinic at [phone].

## 2023-06-17 NOTE — Telephone Encounter (Signed)
 Forwarding to provider CMA.

## 2023-06-21 ENCOUNTER — Ambulatory Visit: Admitting: Family Medicine

## 2023-06-27 ENCOUNTER — Ambulatory Visit: Admitting: Family Medicine

## 2023-06-27 ENCOUNTER — Encounter: Payer: Self-pay | Admitting: Family Medicine

## 2023-06-27 VITALS — BP 111/72 | HR 91 | Temp 97.4°F | Ht 66.0 in | Wt 178.0 lb

## 2023-06-27 DIAGNOSIS — G43E09 Chronic migraine with aura, not intractable, without status migrainosus: Secondary | ICD-10-CM

## 2023-06-27 MED ORDER — NURTEC 75 MG PO TBDP: 75.0000 mg | ORAL_TABLET | Freq: Every day | ORAL | 12 refills | Status: DC | PRN
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
## 2023-06-27 NOTE — Assessment & Plan Note (Signed)
 MRI scheduled for 07/05/23. Await results. Continue PT for neck. Continue NURTEC PRN. Call with any concerns. Follow up about 6 weeks.

## 2023-06-29 NOTE — Telephone Encounter (Signed)
 Completed and faxed.

## 2023-07-13 ENCOUNTER — Encounter: Payer: Self-pay | Admitting: Family Medicine

## 2023-08-08 ENCOUNTER — Ambulatory Visit: Admitting: Family Medicine

## 2023-08-11 ENCOUNTER — Ambulatory Visit
Admission: EM | Admit: 2023-08-11 | Discharge: 2023-08-11 | Disposition: A | Discharge status: Home / Self Care | Attending: Physician Assistant | Admitting: Physician Assistant

## 2023-08-11 DIAGNOSIS — Z87440 Personal history of urinary (tract) infections: Secondary | ICD-10-CM | POA: Diagnosis not present

## 2023-08-11 DIAGNOSIS — R300 Dysuria: Secondary | ICD-10-CM | POA: Diagnosis present

## 2023-08-11 DIAGNOSIS — N3000 Acute cystitis without hematuria: Secondary | ICD-10-CM | POA: Insufficient documentation

## 2023-08-11 LAB — POCT URINALYSIS DIP (MANUAL ENTRY)
Bilirubin, UA: NEGATIVE
Glucose, UA: NEGATIVE mg/dL
Ketones, POC UA: NEGATIVE mg/dL
Nitrite, UA: NEGATIVE
Protein Ur, POC: NEGATIVE mg/dL
Spec Grav, UA: <=1.005 — AB (ref 1.010–1.025)
Urobilinogen, UA: 0.2 U/dL
pH, UA: 7 (ref 5.0–8.0)

## 2023-08-11 MED ORDER — NITROFURANTOIN MONOHYD MACRO 100 MG PO CAPS
100.0000 mg | ORAL_CAPSULE | Freq: Two times a day (BID) | ORAL | 0 refills | Status: DC
Start: 2023-08-11 — End: 2023-08-18

## 2023-08-11 NOTE — ED Triage Notes (Signed)
 I have a UTI, I have had one before and feels the same, hurts really bad at the end of my pee stream. No fever. No abd pain.

## 2023-08-11 NOTE — ED Provider Notes (Signed)
 [facility]    CSN: [id number] Arrival date & time: 08/11/23  1305      History   Chief Complaint Chief Complaint  Patient presents with   UTI Symptoms    HPI Bethany Malone is a 28 y.o. female.   Patient here today for evaluation of possible UTI.  She reports that she has had several in the past and symptoms are the same.  She notes that she has had dysuria, specifically at the end of her urine stream.  She has not had any fever or abdominal pain.  She denies any vomiting.  The history is provided by the patient.    Past Medical History:  Diagnosis Date   Allergy    Anxiety    Depression    GERD (gastroesophageal reflux disease)     Patient Active Problem List   Diagnosis Date Noted   Chronic migraine with aura without status migrainosus, not intractable 05/18/2023   Miscarriage 03/29/2023   Missed abortion 03/29/2023   Encounter for procreative genetic counseling 03/25/2023   Immunization counseling 03/16/2023   Normal pregnancy in first trimester 03/16/2023   Chronic cluster headache 03/16/2023   Panic attacks 01/31/2023   TMJ tenderness, right 08/17/2021   Marijuana smoker 11/24/2015   Depression with anxiety 05/15/2015    Past Surgical History:  Procedure Laterality Date   PLANTAR FASCIA SURGERY Right 2014    OB History     Gravida  1   Para  0   Term  0   Preterm  0   AB  1   Living         SAB  1   IAB  0   Ectopic  0   Multiple      Live Births               Home Medications    Prior to Admission medications   Medication Sig Start Date End Date Taking? Authorizing Provider  Meclizine  HCl 25 MG CHEW Chew 1 tablet by mouth 3 (three) times daily. 05/13/23  Yes [provider]  nitrofurantoin , macrocrystal-monohydrate, (MACROBID ) 100 MG capsule Take 1 capsule (100 mg total) by mouth 2 (two) times daily. 08/11/23  Yes Billy Asberry FALCON, PA-C  Rimegepant Sulfate (NURTEC) 75 MG TBDP Take 1 tablet (75 mg  total) by mouth daily as needed. 06/27/23   Vicci Duwaine SQUIBB, DO    Family History Family History  Problem Relation Age of Onset   Anxiety disorder Mother    Rheum arthritis Mother    Fibromyalgia Mother    Anxiety disorder Father    Anxiety disorder Sister    Cancer Maternal Grandfather    Cancer Paternal Grandmother    Anxiety disorder Paternal Grandmother    Stroke Paternal Grandfather    Cancer Paternal Grandfather    Cancer Maternal Aunt    Cancer Maternal Aunt     Social History Social History   Tobacco Use   Smoking status: Former    Current packs/day: 0.25    Types: Cigarettes    Passive exposure: Current   Smokeless tobacco: Never   Tobacco comments:    She has desire to quit and has not yet committed to quitting  Vaping Use   Vaping status: Former  Substance Use Topics   Alcohol use: Yes    Comment: on occasion   Drug use: Not Currently    Types: Marijuana     Allergies   Fluticasone   Review of Systems  Review of Systems  Constitutional:  Negative for chills and fever.  Eyes:  Negative for discharge and redness.  Respiratory:  Negative for shortness of breath.   Gastrointestinal:  Negative for abdominal pain, nausea and vomiting.  Genitourinary:  Positive for dysuria.     Physical Exam Triage Vital Signs ED Triage Vitals  Encounter Vitals Group     BP 08/11/23 1314 115/77     Girls Systolic BP Percentile --      Girls Diastolic BP Percentile --      Boys Systolic BP Percentile --      Boys Diastolic BP Percentile --      Pulse Rate 08/11/23 1314 79     Resp 08/11/23 1314 18     Temp 08/11/23 1314 97.9 F (36.6 C)     Temp Source 08/11/23 1314 Oral     SpO2 08/11/23 1314 98 %     Weight 08/11/23 1312 165 lb (74.8 kg)     Height 08/11/23 1312 5' 6 (1.676 m)     Head Circumference --      Peak Flow --      Pain Score 08/11/23 1309 0     Pain Loc --      Pain Education --      Exclude from Growth Chart --    No data found.  Updated  Vital Signs BP 115/77 (BP Location: Left Arm)   Pulse 79   Temp 97.9 F (36.6 C) (Oral)   Resp 18   Ht 5' 6 (1.676 m)   Wt 165 lb (74.8 kg)   LMP 08/09/2023 (Exact Date)   SpO2 98%   BMI 26.63 kg/m   Visual Acuity Right Eye Distance:   Left Eye Distance:   Bilateral Distance:    Right Eye Near:   Left Eye Near:    Bilateral Near:     Physical Exam Vitals and nursing note reviewed.  Constitutional:      General: She is not in acute distress.    Appearance: Normal appearance. She is not ill-appearing.  HENT:     Head: Normocephalic and atraumatic.  Eyes:     Conjunctiva/sclera: Conjunctivae normal.  Cardiovascular:     Rate and Rhythm: Normal rate.  Pulmonary:     Effort: Pulmonary effort is normal. No respiratory distress.  Neurological:     Mental Status: She is alert.  Psychiatric:        Mood and Affect: Mood normal.        Behavior: Behavior normal.        Thought Content: Thought content normal.      UC Treatments / Results  Labs (all labs ordered are listed, but only abnormal results are displayed) Labs Reviewed  POCT URINALYSIS DIP (MANUAL ENTRY) - Abnormal; Notable for the following components:      Result Value   Spec Grav, UA <=1.005 (*)    Blood, UA small (*)    Leukocytes, UA Trace (*)    All other components within normal limits  URINE CULTURE    EKG   Radiology No results found.  Procedures Procedures (including critical care time)  Medications Ordered in UC Medications - No data to display  Initial Impression / Assessment and Plan / UC Course  I have reviewed the triage vital signs and the nursing notes.  Pertinent labs & imaging results that were available during my care of the patient were reviewed by me and considered in my medical decision making (see  chart for details).    UA presentation consistent with UTI.  Will order Macrobid  and urine culture ordered.  Advised follow-up if no gradual improvement with any further  concerns.  Final Clinical Impressions(s) / UC Diagnoses   Final diagnoses:  Acute cystitis without hematuria   Discharge Instructions   None    ED Prescriptions     Medication Sig Dispense Auth. Provider   nitrofurantoin , macrocrystal-monohydrate, (MACROBID ) 100 MG capsule Take 1 capsule (100 mg total) by mouth 2 (two) times daily. 10 capsule Billy Asberry FALCON, PA-C      PDMP not reviewed this encounter.   Billy Asberry FALCON, PA-C 08/11/23 1927

## 2023-08-13 LAB — URINE CULTURE: Culture: >=100000 — AB

## 2023-08-15 ENCOUNTER — Ambulatory Visit (HOSPITAL_COMMUNITY): Payer: Self-pay

## 2023-08-17 ENCOUNTER — Telehealth: Payer: Self-pay | Admitting: Family Medicine

## 2023-08-17 ENCOUNTER — Ambulatory Visit: Payer: Self-pay

## 2023-08-17 NOTE — Telephone Encounter (Signed)
 FYI Only or Action Required?: Action required by provider: medication refill request.  Patient was last seen in primary care on 06/27/2023 by Vicci Bouchard P, DO.  Called Nurse Triage reporting No chief complaint on file..  Symptoms began a week ago.  Interventions attempted: Prescription medications: macrobid .  Symptoms are: gradually improving.  Triage Disposition: See Physician Within 24 Hours  Patient/caregiver understands and will follow disposition?: No, wishes to speak with PCP    Reason for Disposition  All other patients with painful urination  (Exception: [1] EITHER frequency or urgency AND [2] has on-call doctor.)  Answer Assessment - Initial Assessment Questions 1. SEVERITY: How bad is the pain?  (e.g., Scale 1-10; mild, moderate, or severe)     7/10 2. FREQUENCY: How many times have you had painful urination today?      Every times 3. PATTERN: Is pain present every time you urinate or just sometimes?      Every time after urinating 4. ONSET: When did the painful urination start?      08/11/23 5. FEVER: Do you have a fever? If Yes, ask: What is your temperature, how was it measured, and when did it start?     no 6. PAST UTI: Have you had a urine infection before? If Yes, ask: When was the last time? and What happened that time?      Just finished a 5 day round of microbid 7. CAUSE: What do you think is causing the painful urination?  (e.g., UTI, scratch, Herpes sore)     uti 8. OTHER SYMPTOMS: Do you have any other symptoms? (e.g., blood in urine, flank pain, genital sores, urgency, vaginal discharge)     Abd cramping  Protocols used: Urination Pain - Female-A-AH

## 2023-08-17 NOTE — Telephone Encounter (Signed)
 Pt triaged, see NT encounter

## 2023-08-17 NOTE — Telephone Encounter (Signed)
 We would at least need a repeat urine to be able to send in a medication we didn't prescribe.

## 2023-08-17 NOTE — Telephone Encounter (Signed)
 Copied from CRM #[id number]. Topic: Clinical - Medication Refill >> Aug 17, 2023  1:44 PM Carlatta H wrote: Medication: nitrofurantoin , macrocrystal-monohydrate, (MACROBID ) 100 MG capsule [[id number]]  Has the patient contacted their pharmacy? No (Agent: If no, request that the patient contact the pharmacy for the refill. If patient does not wish to contact the pharmacy document the reason why and proceed with request.) (Agent: If yes, when and what did the pharmacy advise?)  This is the patient's preferred pharmacy:  [facility] - [place], [place] - [address] [place] [address] Phone: [phone] Fax: [phone]  Is this the correct pharmacy for this prescription? Yes If no, delete pharmacy and type the correct one.   Has the prescription been filled recently? No  Is the patient out of the medication? Yes  Has the patient been seen for an appointment in the last year OR does the patient have an upcoming appointment? No  Can we respond through MyChart? No  Agent: Please be advised that Rx refills may take up to 3 business days. We ask that you follow-up with your pharmacy.

## 2023-08-17 NOTE — Telephone Encounter (Signed)
 Pt requesting refill on macrobid , was seen in UC and jsut finished a 5 day course of it and states still having symptoms. Pt states she would prefer not to come in as she has a high copay.

## 2023-08-18 ENCOUNTER — Ambulatory Visit: Admitting: Family Medicine

## 2023-08-18 ENCOUNTER — Encounter: Payer: Self-pay | Admitting: Family Medicine

## 2023-08-18 ENCOUNTER — Telehealth: Payer: Self-pay

## 2023-08-18 ENCOUNTER — Other Ambulatory Visit (HOSPITAL_COMMUNITY): Payer: Self-pay

## 2023-08-18 VITALS — BP 132/82 | HR 71 | Temp 97.5°F | Ht 66.0 in | Wt 163.0 lb

## 2023-08-18 DIAGNOSIS — R300 Dysuria: Secondary | ICD-10-CM | POA: Diagnosis not present

## 2023-08-18 LAB — URINALYSIS, ROUTINE W REFLEX MICROSCOPIC
Bilirubin, UA: NEGATIVE
Glucose, UA: NEGATIVE
Ketones, UA: NEGATIVE
Leukocytes,UA: NEGATIVE
Nitrite, UA: NEGATIVE
Protein,UA: NEGATIVE
RBC, UA: NEGATIVE
Specific Gravity, UA: 1.015 (ref 1.005–1.030)
Urobilinogen, Ur: 0.2 mg/dL (ref 0.2–1.0)
pH, UA: 5.5 (ref 5.0–7.5)

## 2023-08-18 NOTE — Telephone Encounter (Signed)
 Pharmacy Patient Advocate Encounter   Received notification from CoverMyMeds that prior authorization for Nurtec 75MG  dispersible tablets  is required/requested.   Insurance verification completed.   The patient is insured through [employer] .   Per test claim: PA required; PA started via CoverMyMeds. KEY [id number] . Please see clinical question(s) below that I am not finding the answer to in their chart and advise.

## 2023-08-18 NOTE — Progress Notes (Signed)
 BP 132/82   Pulse 71   Temp (!) 97.5 F (36.4 C) (Oral)   Ht 5' 6 (1.676 m)   Wt 163 lb (73.9 kg)   LMP 08/09/2023 (Exact Date)   SpO2 100%   BMI 26.31 kg/m    Subjective:    Patient ID: Bethany Malone, female    DOB: [birth year], 28 y.o.   MRN: [id number]  HPI: Bethany Malone is a 28 y.o. female  Chief Complaint  Patient presents with   Urinary Tract Infection    UC 7/24 Given antibiotics still feels symptomatic.  Dysuria    URINARY SYMPTOMS Duration: 1 week Dysuria: no Urinary frequency: no Urgency: no Small volume voids: no Symptom severity: mild Urinary incontinence: no Foul odor: no Hematuria: no Abdominal pain: no Back pain: no Suprapubic pain/pressure: no Flank pain: no Fever:  no Vomiting: no Relief with cranberry juice: no Relief with pyridium : no Status: better Previous urinary tract infection: yes Recurrent urinary tract infection: no History of sexually transmitted disease: no Vaginal discharge: no Treatments attempted: antibiotics and increasing fluids    Relevant past medical, surgical, family and social history reviewed and updated as indicated. Interim medical history since our last visit reviewed. Allergies and medications reviewed and updated.  Review of Systems  Constitutional: Negative.   Respiratory: Negative.    Cardiovascular: Negative.   Genitourinary: Negative.   Musculoskeletal: Negative.   Psychiatric/Behavioral: Negative.      Per HPI unless specifically indicated above     Objective:    BP 132/82   Pulse 71   Temp (!) 97.5 F (36.4 C) (Oral)   Ht 5' 6 (1.676 m)   Wt 163 lb (73.9 kg)   LMP 08/09/2023 (Exact Date)   SpO2 100%   BMI 26.31 kg/m   Wt Readings from Last 3 Encounters:  08/18/23 163 lb (73.9 kg)  08/11/23 165 lb (74.8 kg)  06/27/23 178 lb (80.7 kg)    Physical Exam Vitals and nursing note reviewed.  Constitutional:      General: She is not in acute distress.    Appearance: Normal appearance.  She is not ill-appearing, toxic-appearing or diaphoretic.  HENT:     Head: Normocephalic and atraumatic.     Right Ear: External ear normal.     Left Ear: External ear normal.     Nose: Nose normal.     Mouth/Throat:     Mouth: Mucous membranes are moist.     Pharynx: Oropharynx is clear.  Eyes:     General: No scleral icterus.       Right eye: No discharge.        Left eye: No discharge.     Extraocular Movements: Extraocular movements intact.     Conjunctiva/sclera: Conjunctivae normal.     Pupils: Pupils are equal, round, and reactive to light.  Cardiovascular:     Rate and Rhythm: Normal rate and regular rhythm.     Pulses: Normal pulses.     Heart sounds: Normal heart sounds. No murmur heard.    No friction rub. No gallop.  Pulmonary:     Effort: Pulmonary effort is normal. No respiratory distress.     Breath sounds: Normal breath sounds. No stridor. No wheezing, rhonchi or rales.  Chest:     Chest wall: No tenderness.  Musculoskeletal:        General: Normal range of motion.     Cervical back: Normal range of motion and neck supple.  Skin:    General:  Skin is warm and dry.     Capillary Refill: Capillary refill takes less than 2 seconds.     Coloration: Skin is not jaundiced or pale.     Findings: No bruising, erythema, lesion or rash.  Neurological:     General: No focal deficit present.     Mental Status: She is alert and oriented to person, place, and time. Mental status is at baseline.  Psychiatric:        Mood and Affect: Mood normal.        Behavior: Behavior normal.        Thought Content: Thought content normal.        Judgment: Judgment normal.     Results for orders placed or performed during the hospital encounter of 08/11/23  POCT urinalysis dipstick   Collection Time: 08/11/23  1:21 PM  Result Value Ref Range   Color, UA yellow yellow   Clarity, UA clear clear   Glucose, UA negative negative mg/dL   Bilirubin, UA negative negative   Ketones, POC  UA negative negative mg/dL   Spec Grav, UA <=8.994 (A) 1.010 - 1.025   Blood, UA small (A) negative   pH, UA 7.0 5.0 - 8.0   Protein Ur, POC negative negative mg/dL   Urobilinogen, UA 0.2 0.2 or 1.0 E.U./dL   Nitrite, UA Negative Negative   Leukocytes, UA Trace (A) Negative  Urine Culture   Collection Time: 08/11/23  1:32 PM   Specimen: Urine, Clean Catch  Result Value Ref Range   Specimen Description URINE, CLEAN CATCH    Special Requests      NONE Performed at [facility], [address] [place], [place] [address]    Culture >=100,000 COLONIES/mL STAPHYLOCOCCUS SAPROPHYTICUS (A)    Report Status 08/13/2023 FINAL    Organism ID, Bacteria STAPHYLOCOCCUS SAPROPHYTICUS (A)       Susceptibility   Staphylococcus saprophyticus - MIC*    CIPROFLOXACIN  <=0.5 SENSITIVE Sensitive     GENTAMICIN <=0.5 SENSITIVE Sensitive     NITROFURANTOIN  <=16 SENSITIVE Sensitive     OXACILLIN >=4 RESISTANT Resistant     TETRACYCLINE <=1 SENSITIVE Sensitive     VANCOMYCIN 1 SENSITIVE Sensitive     TRIMETH /SULFA  <=10 SENSITIVE Sensitive     RIFAMPIN <=0.5 SENSITIVE Sensitive     Inducible Clindamycin NEGATIVE Sensitive     * >=100,000 COLONIES/mL STAPHYLOCOCCUS SAPROPHYTICUS      Assessment & Plan:   Problem List Items Addressed This Visit   None Visit Diagnoses       Dysuria    -  Primary   UA clear. Will send for culture. Await results. Treat as needed.   Relevant Orders   Urinalysis, Routine w reflex microscopic   Urine Culture        Follow up plan: Return for As scheduled.

## 2023-08-19 NOTE — Telephone Encounter (Signed)
 Hi,  note from 05/18/2023 says patient has failed imitrex as a kid. Thank you for your continued assistance.

## 2023-08-20 LAB — URINE CULTURE

## 2023-08-22 ENCOUNTER — Ambulatory Visit: Payer: Self-pay | Admitting: Nurse Practitioner

## 2023-08-22 NOTE — Progress Notes (Signed)
 Contacted via MyChart  Urine is showing no infection at this time.

## 2023-08-22 NOTE — Telephone Encounter (Signed)
 Pharmacy Patient Advocate Encounter   Received notification from CoverMyMeds that prior authorization for Nurtec 75MG  dispersible tablets  is required/requested.   Insurance verification completed.   The patient is insured through [employer] .   Per test claim: PA required; PA submitted to above mentioned insurance via CoverMyMeds Key/confirmation #/EOC [id number] Status is pending

## 2023-08-23 ENCOUNTER — Encounter: Payer: Self-pay | Admitting: Family Medicine

## 2023-08-25 ENCOUNTER — Other Ambulatory Visit (HOSPITAL_COMMUNITY): Payer: Self-pay

## 2023-08-25 NOTE — Telephone Encounter (Signed)
 Pharmacy Patient Advocate Encounter  Received notification from [employer] that Prior Authorization for  Nurtec 75MG  dispersible tablets  has been DENIED.  Full denial letter will be uploaded to the media tab. See denial reason below.     PA #/Case ID/Reference #: [id number]

## 2023-09-02 ENCOUNTER — Other Ambulatory Visit: Payer: Self-pay

## 2023-09-02 ENCOUNTER — Ambulatory Visit
Admission: EM | Admit: 2023-09-02 | Discharge: 2023-09-02 | Disposition: A | Discharge status: Home / Self Care | Attending: Nurse Practitioner | Admitting: Nurse Practitioner

## 2023-09-02 ENCOUNTER — Encounter: Payer: Self-pay | Admitting: *Deleted

## 2023-09-02 DIAGNOSIS — B9789 Other viral agents as the cause of diseases classified elsewhere: Secondary | ICD-10-CM | POA: Diagnosis not present

## 2023-09-02 DIAGNOSIS — J019 Acute sinusitis, unspecified: Secondary | ICD-10-CM

## 2023-09-02 DIAGNOSIS — R0981 Nasal congestion: Secondary | ICD-10-CM

## 2023-09-02 LAB — POC SOFIA SARS ANTIGEN FIA: SARS Coronavirus 2 Ag: NEGATIVE

## 2023-09-02 MED ORDER — CETIRIZINE-PSEUDOEPHEDRINE ER 5-120 MG PO TB12
1.0000 | ORAL_TABLET | Freq: Every day | ORAL | 0 refills | Status: AC
Start: 2023-09-02 — End: 2023-09-12

## 2023-09-02 MED ORDER — AZELASTINE HCL 0.1 % NA SOLN: 2.0000 | Freq: Two times a day (BID) | NASAL | 0 refills | Status: DC

## 2023-09-02 NOTE — Discharge Instructions (Addendum)
 You have been diagnosed with acute viral sinusitis. Your COVID-19 test was negative. Use the Azelastine  nasal spray and Zyrtec -D as prescribed to help with congestion and sinus pressure. A humidifier at night or steam from a hot shower can help relieve nasal congestion and promote drainage. Drink plenty of fluids, rest, and avoid exposure to smoke or strong odors that may worsen your symptoms. Saline nasal rinses can also be used to help clear mucus. Most cases improve within 7-10 days. Follow up with your primary care provider if your symptoms persist beyond 10 days, worsen after initial improvement, or if you develop a fever. Seek emergency care immediately if you experience severe facial swelling, vision changes, confusion, high fever, or severe headache.

## 2023-09-02 NOTE — ED Provider Notes (Signed)
 [facility]    CSN: [id number] Arrival date & time: 09/02/23  1214      History   Chief Complaint Chief Complaint  Patient presents with   Facial Pain    HPI Bethany Malone is a 28 y.o. female.   Discussed the use of AI scribe software for clinical note transcription with the patient, who gave verbal consent to proceed.   The patient presents with concerns of a possible sinus infection that started yesterday. She reports experiencing pressure in her sinuses, which are tender and irritated. The patient also mentions having some post-nasal drip and sore throat but denies any cough. Additionally, she notes that her left ear has started to bother her, describing it as an irritation. The patient denies fever, chills, body aches, nausea, vomiting, diarrhea, or headaches. She has not taken any medication for her symptoms. The patient mentions a history of previous sinus infections, which prompted her to seek care today. She denies being around anyone who has been sick recently.   The following portions of the patient's history were reviewed and updated as appropriate: allergies, current medications, past family history, past medical history, past social history, past surgical history, and problem list.    Past Medical History:  Diagnosis Date   Allergy    Anxiety    Depression    GERD (gastroesophageal reflux disease)     Patient Active Problem List   Diagnosis Date Noted   Chronic migraine with aura without status migrainosus, not intractable 05/18/2023   Miscarriage 03/29/2023   Missed abortion 03/29/2023   Encounter for procreative genetic counseling 03/25/2023   Immunization counseling 03/16/2023   Normal pregnancy in first trimester 03/16/2023   Chronic cluster headache 03/16/2023   Panic attacks 01/31/2023   TMJ tenderness, right 08/17/2021   Marijuana smoker 11/24/2015   Depression with anxiety 05/15/2015    Past Surgical History:  Procedure Laterality  Date   PLANTAR FASCIA SURGERY Right 2014    OB History     Gravida  1   Para  0   Term  0   Preterm  0   AB  1   Living         SAB  1   IAB  0   Ectopic  0   Multiple      Live Births               Home Medications    Prior to Admission medications   Medication Sig Start Date End Date Taking? Authorizing Provider  azelastine  (ASTELIN ) 0.1 % nasal spray Place 2 sprays into both nostrils 2 (two) times daily. Use in each nostril as directed 09/02/23  Yes Tyshana Nishida, We[place], FNP  cetirizine -pseudoephedrine  (ZYRTEC -D) 5-120 MG tablet Take 1 tablet by mouth daily with breakfast for 10 days. 09/02/23 09/12/23 Yes Iola Lukes, FNP  Rimegepant Sulfate (NURTEC) 75 MG TBDP Take 1 tablet (75 mg total) by mouth daily as needed. 06/27/23  Yes Vicci Duwaine SQUIBB, DO    Family History Family History  Problem Relation Age of Onset   Anxiety disorder Mother    Rheum arthritis Mother    Fibromyalgia Mother    Anxiety disorder Father    Anxiety disorder Sister    Cancer Maternal Grandfather    Cancer Paternal Grandmother    Anxiety disorder Paternal Grandmother    Stroke Paternal Grandfather    Cancer Paternal Grandfather    Cancer Maternal Aunt    Cancer Maternal Aunt  Social History Social History   Tobacco Use   Smoking status: Former    Current packs/day: 0.25    Types: Cigarettes    Passive exposure: Current   Smokeless tobacco: Never   Tobacco comments:    She has desire to quit and has not yet committed to quitting  Vaping Use   Vaping status: Former  Substance Use Topics   Alcohol use: Yes    Comment: on occasion   Drug use: Not Currently    Types: Marijuana     Allergies   Fluticasone   Review of Systems Review of Systems  Constitutional:  Negative for chills and fever.  HENT:  Positive for congestion, ear pain (left), postnasal drip and sinus pressure. Negative for sore throat.   Respiratory:  Negative for chest tightness.    Cardiovascular:  Negative for chest pain.  Gastrointestinal:  Negative for diarrhea, nausea and vomiting.  Musculoskeletal:  Negative for myalgias.  Neurological:  Positive for headaches.  All other systems reviewed and are negative.    Physical Exam Triage Vital Signs ED Triage Vitals  Encounter Vitals Group     BP 09/02/23 1249 107/73     Girls Systolic BP Percentile --      Girls Diastolic BP Percentile --      Boys Systolic BP Percentile --      Boys Diastolic BP Percentile --      Pulse Rate 09/02/23 1249 86     Resp 09/02/23 1249 16     Temp 09/02/23 1249 97.8 F (36.6 C)     Temp Source 09/02/23 1249 Oral     SpO2 09/02/23 1249 99 %     Weight --      Height --      Head Circumference --      Peak Flow --      Pain Score 09/02/23 1247 4     Pain Loc --      Pain Education --      Exclude from Growth Chart --    No data found.  Updated Vital Signs BP 107/73 (BP Location: Left Arm)   Pulse 86   Temp 97.8 F (36.6 C) (Oral)   Resp 16   LMP 08/09/2023 (Exact Date)   SpO2 99%   Breastfeeding No   Visual Acuity Right Eye Distance:   Left Eye Distance:   Bilateral Distance:    Right Eye Near:   Left Eye Near:    Bilateral Near:     Physical Exam Vitals reviewed.  Constitutional:      General: She is awake. She is not in acute distress.    Appearance: Normal appearance. She is well-developed. She is not ill-appearing, toxic-appearing or diaphoretic.  HENT:     Head: Normocephalic.     Right Ear: Hearing, tympanic membrane, ear canal and external ear normal. No drainage, swelling or tenderness. No middle ear effusion. Tympanic membrane is not erythematous.     Left Ear: Hearing, tympanic membrane, ear canal and external ear normal. No drainage, swelling or tenderness.  No middle ear effusion. Tympanic membrane is not erythematous.     Nose: Congestion present. No rhinorrhea.     Right Sinus: Maxillary sinus tenderness and frontal sinus tenderness  present.     Left Sinus: Maxillary sinus tenderness and frontal sinus tenderness present.     Comments: Mild tenderness within the maxillary and frontal sinuses bilaterally    Mouth/Throat:     Lips: Pink.  Mouth: Mucous membranes are moist.     Pharynx: Oropharynx is clear. Uvula midline. No pharyngeal swelling, oropharyngeal exudate, posterior oropharyngeal erythema or uvula swelling.     Tonsils: No tonsillar exudate or tonsillar abscesses.  Eyes:     General: Vision grossly intact.     Conjunctiva/sclera: Conjunctivae normal.  Cardiovascular:     Rate and Rhythm: Normal rate and regular rhythm.     Heart sounds: Normal heart sounds.  Pulmonary:     Effort: Pulmonary effort is normal.     Breath sounds: Normal breath sounds and air entry.  Musculoskeletal:        General: Normal range of motion.     Cervical back: Full passive range of motion without pain, normal range of motion and neck supple.  Lymphadenopathy:     Cervical: No cervical adenopathy.  Skin:    General: Skin is warm and dry.  Neurological:     General: No focal deficit present.     Mental Status: She is alert and oriented to person, place, and time.  Psychiatric:        Mood and Affect: Mood normal.        Behavior: Behavior normal. Behavior is cooperative.      UC Treatments / Results  Labs (all labs ordered are listed, but only abnormal results are displayed) Labs Reviewed  POC SOFIA SARS ANTIGEN FIA - Normal    EKG   Radiology No results found.  Procedures Procedures (including critical care time)  Medications Ordered in UC Medications - No data to display  Initial Impression / Assessment and Plan / UC Course  I have reviewed the triage vital signs and the nursing notes.  Pertinent labs & imaging results that were available during my care of the patient were reviewed by me and considered in my medical decision making (see chart for details).     Patient presents with sinus pressure,  nasal congestion, and post-nasal drip beginning yesterday, without fever, chills, body aches, gastrointestinal symptoms, headaches, or cough. Presentation is consistent with acute viral sinusitis given the short duration and absence of fever. COVID-19 test performed as a precaution and was negative. Symptomatic management recommended, including use of a humidifier at night or steam from a hot shower for relief. Azelastine  nasal spray and Zyrtec -D prescribed. Patient advised to follow up with PCP if symptoms persist beyond 10 days, worsen after initial improvement, or if fever develops, and to seek emergency care for severe facial swelling, vision changes, confusion, or high fever.   Today's evaluation has revealed no signs of a dangerous process. Discussed diagnosis with patient and/or guardian. Patient and/or guardian aware of their diagnosis, possible red flag symptoms to watch out for and need for close follow up. Patient and/or guardian understands verbal and written discharge instructions. Patient and/or guardian comfortable with plan and disposition.  Patient and/or guardian has a clear mental status at this time, good insight into illness (after discussion and teaching) and has clear judgment to make decisions regarding their care  Documentation was completed with the aid of voice recognition software. Transcription may contain typographical errors. Final Clinical Impressions(s) / UC Diagnoses   Final diagnoses:  Nasal sinus congestion  Acute viral sinusitis     Discharge Instructions      You have been diagnosed with acute viral sinusitis. Your COVID-19 test was negative. Use the Azelastine  nasal spray and Zyrtec -D as prescribed to help with congestion and sinus pressure. A humidifier at night or steam from a  hot shower can help relieve nasal congestion and promote drainage. Drink plenty of fluids, rest, and avoid exposure to smoke or strong odors that may worsen your symptoms. Saline nasal  rinses can also be used to help clear mucus. Most cases improve within 7-10 days. Follow up with your primary care provider if your symptoms persist beyond 10 days, worsen after initial improvement, or if you develop a fever. Seek emergency care immediately if you experience severe facial swelling, vision changes, confusion, high fever, or severe headache.      ED Prescriptions     Medication Sig Dispense Auth. Provider   cetirizine -pseudoephedrine  (ZYRTEC -D) 5-120 MG tablet Take 1 tablet by mouth daily with breakfast for 10 days. 10 tablet Iola Lukes, FNP   azelastine  (ASTELIN ) 0.1 % nasal spray Place 2 sprays into both nostrils 2 (two) times daily. Use in each nostril as directed 30 mL Iola Lukes, FNP      PDMP not reviewed this encounter.   Iola Lukes, [place] 09/02/23 1420

## 2023-09-02 NOTE — ED Triage Notes (Signed)
 States sinus pain x 2 days. Left ear pain today. Denies sore throat. Denies fever. Voices concern for sinus infection

## 2023-09-07 ENCOUNTER — Ambulatory Visit: Payer: Self-pay

## 2023-09-07 NOTE — Telephone Encounter (Signed)
 FYI Only or Action Required?: FYI only for provider.  Patient was last seen in primary care on 08/18/2023 by Bethany Duwaine SQUIBB, DO.  Called Nurse Triage reporting Sinusitis.  Symptoms began a week ago.  Interventions attempted: OTC medications: zyrtec .  Symptoms are: gradually worsening.  Triage Disposition: See PCP When Office is Open (Within 3 Days)  Patient/caregiver understands and will follow disposition?: Yes FYI:  Copied from CRM #[id number]. Topic: Clinical - Red Word Triage >> Sep 07, 2023 11:21 AM Treva T wrote: [facility] that prompted transfer to Nurse Triage: Received call from patient reports she may have a sinus infection, symptoms including facial pain and pressure, sinus congestion, and green colored mucous, and post nasal drip. Reason for Disposition  [1] Using nasal washes and pain medicine > 24 hours AND [2] sinus pain (around cheekbone or eye) persists  Answer Assessment - Initial Assessment Questions 1. LOCATION: Where does it hurt?      Behind and under eyes, and above eyes, ears clogged  2. ONSET: When did the sinus pain start?  (e.g., hours, days)      Started 1 week ago  3. SEVERITY: How bad is the pain?   (Scale 0-10; or none, mild, moderate or severe)     Mild to moderate  4. RECURRENT SYMPTOM: Have you ever had sinus problems before? If Yes, ask: When was the last time? and What happened that time?      Has had sinus infections before  5. NASAL CONGESTION: Is the nose blocked? If Yes, ask: Can you open it or must you breathe through your mouth?     Yes  6. NASAL DISCHARGE: Do you have discharge from your nose? If so ask, What color?     Green discharge  7. FEVER: Do you have a fever? If Yes, ask: What is it, how was it measured, and when did it start?      No  8. OTHER SYMPTOMS: Do you have any other symptoms? (e.g., sore throat, cough, earache, difficulty breathing)     No  9. PREGNANCY: Is there any chance you are  pregnant? When was your last menstrual period?     LMP- 7/22  Protocols used: Sinus Pain or Congestion-A-AH

## 2023-09-09 ENCOUNTER — Encounter: Payer: Self-pay | Admitting: Pediatrics

## 2023-09-09 ENCOUNTER — Ambulatory Visit: Admitting: Pediatrics

## 2023-09-09 VITALS — BP 98/62 | HR 80 | Temp 97.8°F | Wt 163.0 lb

## 2023-09-09 DIAGNOSIS — B9689 Other specified bacterial agents as the cause of diseases classified elsewhere: Secondary | ICD-10-CM

## 2023-09-09 DIAGNOSIS — J329 Chronic sinusitis, unspecified: Secondary | ICD-10-CM | POA: Diagnosis not present

## 2023-09-09 MED ORDER — AMOXICILLIN 500 MG PO CAPS: 500.0000 mg | ORAL_CAPSULE | Freq: Three times a day (TID) | ORAL | 0 refills | Status: AC

## 2023-09-09 NOTE — Patient Instructions (Signed)
 I sent amoxicillin  to take for the next 7 days, please return if symptoms do not improve

## 2023-09-09 NOTE — Progress Notes (Signed)
 Office Visit  BP 98/62   Pulse 80   Temp 97.8 F (36.6 C) (Oral)   Wt 163 lb (73.9 kg)   LMP 08/09/2023 (Exact Date)   SpO2 99%   BMI 26.31 kg/m    Subjective:    Patient ID: Bethany Malone, female    DOB: 1995/06/03, 28 y.o.   MRN: [id number]  HPI: Bethany Malone is a 28 y.o. female  Chief Complaint  Patient presents with   Sinusitis    Went to urgent care last week and was told to come back if symptoms haven't improved still has green mucus     Discussed the use of AI scribe software for clinical note transcription with the patient, who gave verbal consent to proceed.  History of Present Illness   Bethany Malone is a 28 year old female who presents with persistent sinus congestion and nasal discharge.  She has been experiencing persistent sinus congestion and nasal discharge since September 02, 2023. Initially, she sought care at an urgent care facility and used over-the-counter medications. Her symptoms had been present for two to three days at that time.  Currently, her symptoms are similar to those experienced earlier, with some improvement but persistent congestion. She notes blowing 'giant green boogers' in the morning and experiencing mild pressure, particularly on the left side. No throat symptoms, fever, or significant cough, although she mentions a cough related to postnasal drip.  The congestion is most bothersome in the morning, causing her to feel 'crappy,' but she feels better as the day progresses. She frequently has to sniff back the congestion if unable to blow her nose.  She has experienced some ear pain that flares up occasionally but is not currently painful. She also notes tenderness in her nasal passages and occasional epistaxis.        Relevant past medical, surgical, family and social history reviewed and updated as indicated. Interim medical history since our last visit reviewed. Allergies and medications reviewed and updated.  ROS per HPI unless  specifically indicated above     Objective:    BP 98/62   Pulse 80   Temp 97.8 F (36.6 C) (Oral)   Wt 163 lb (73.9 kg)   LMP 08/09/2023 (Exact Date)   SpO2 99%   BMI 26.31 kg/m   Wt Readings from Last 3 Encounters:  09/09/23 163 lb (73.9 kg)  08/18/23 163 lb (73.9 kg)  08/11/23 165 lb (74.8 kg)     Physical Exam Constitutional:      Appearance: Normal appearance.  HENT:     Nose:     Right Turbinates: Enlarged and swollen.     Left Turbinates: Enlarged and swollen.     Right Sinus: No maxillary sinus tenderness or frontal sinus tenderness.     Left Sinus: Maxillary sinus tenderness present. No frontal sinus tenderness.  Pulmonary:     Effort: Pulmonary effort is normal.  Musculoskeletal:        General: Normal range of motion.  Skin:    Comments: Normal skin color  Neurological:     General: No focal deficit present.     Mental Status: She is alert. Mental status is at baseline.  Psychiatric:        Mood and Affect: Mood normal.        Behavior: Behavior normal.        Thought Content: Thought content normal.         05/18/2023    1:15 PM 02/07/2023  10:06 AM 01/31/2023    1:13 PM 05/21/2022   10:44 AM 03/23/2022    1:23 PM  Depression screen PHQ 2/9  Decreased Interest 0 1 1 0 0  Down, Depressed, Hopeless 0 1 1 0 0  PHQ - 2 Score 0 2 2 0 0  Altered sleeping 1 1 1  0 0  Tired, decreased energy 1 1 1  0 0  Change in appetite 1 0 0 0 0  Feeling bad or failure about yourself  1 1 1  0 0  Trouble concentrating 0 1 1 0 0  Moving slowly or fidgety/restless 0 0 0 0 0  Suicidal thoughts 0 0 0 0 0  PHQ-9 Score 4 6 6  0 0  Difficult doing work/chores  Somewhat difficult  Not difficult at all Not difficult at all       05/18/2023    1:17 PM 02/07/2023   10:07 AM 01/31/2023    1:13 PM 05/21/2022   10:44 AM  GAD 7 : Generalized Anxiety Score  Nervous, Anxious, on Edge 2 2 3  0  Control/stop worrying 2 3 3  0  Worry too much - different things 2 3 3  0  Trouble relaxing  1 2 3  0  Restless 1 1 2  0  Easily annoyed or irritable 2 3 3  0  Afraid - awful might happen 2 2 3  0  Total GAD 7 Score 12 16 20  0  Anxiety Difficulty Very difficult Very difficult Very difficult Not difficult at all       Assessment & Plan:  Assessment & Plan   Bacterial sinusitis Symptoms suggest bacterial sinusitis with persistent congestion, green nasal discharge, and sinus pressure. - Prescribed amoxicillin  for 7 days  - Advised to return if symptoms persist or worsen.  -     Amoxicillin ; Take 1 capsule (500 mg total) by mouth 3 (three) times daily for 7 days.  Dispense: 21 capsule; Refill: 0   Follow up plan: No follow-ups on file.  Bethany SHAUNNA Nett, MD

## 2023-09-20 ENCOUNTER — Ambulatory Visit: Payer: Self-pay

## 2023-09-20 NOTE — Telephone Encounter (Signed)
 FYI Only or Action Required?: FYI only for provider.  Patient was last seen in primary care on 09/09/2023 by Herold Hadassah SQUIBB, MD.  Called Nurse Triage reporting Recurrent Sinusitis.  Symptoms returned yesterday.  Interventions attempted: Prescription medications: amoxicillin .  Symptoms are: headache/sinus pain; sneezing, nasal clear drainage, light left nostril epistaxis episode today resolved after antibiotic and returned within about 2 days, gradually worsening.  Triage Disposition: See PCP When Office is Open (Within 3 Days)  Patient/caregiver understands and will follow disposition?: Yes             Message from [place] G sent at 09/20/2023 12:43 PM EDT  pt got prescribed antibiotics for sinus infection and the infection still havent went away she wants an appointment to come back in and be seen. Please follow up with pt concerning this   Reason for Disposition  [1] Sinus congestion (pressure, fullness) AND [2] present > 10 days  Answer Assessment - Initial Assessment Questions 1. ANTIBIOTIC: What antibiotic are you taking? How many times a day?     Completed amoxicillin  three times daily, x 7 days.  2. ONSET: When was the antibiotic started?     09/09/23  3. PAIN: How bad is the pain?   (Scale 0-10; or none, mild, moderate or severe)     4/10. Facial sinuses, deep in nose, around eyes.  4. FEVER: Do you have a fever? If Yes, ask: What is it, how was it measured, and when did it start?      No.  5. SYMPTOMS: Are there any other symptoms you're concerned about? If Yes, ask: When did it start?     Symptoms returned yesterday. Sneezing, nasal drainage (clear), left sided epistaxis (today, 1 episode, light bleeding), headaches. Denies sore throat, ear aches, SOB.  6. PREGNANCY: Is there any chance you are pregnant? When was your last menstrual period?     LMP: 09/13/23.  Protocols used: Sinus Infection on Antibiotic Follow-up Call-A-AH, Sinus Pain  or Congestion-A-AH

## 2023-09-21 ENCOUNTER — Encounter: Payer: Self-pay | Admitting: Family Medicine

## 2023-09-21 ENCOUNTER — Ambulatory Visit: Admitting: Family Medicine

## 2023-09-21 VITALS — BP 135/82 | HR 78 | Temp 97.9°F | Ht 66.0 in | Wt 164.2 lb

## 2023-09-21 DIAGNOSIS — J0101 Acute recurrent maxillary sinusitis: Secondary | ICD-10-CM | POA: Diagnosis not present

## 2023-09-21 MED ORDER — LEVOFLOXACIN 750 MG PO TABS: 750.0000 mg | ORAL_TABLET | Freq: Every day | ORAL | 0 refills | Status: DC

## 2023-09-21 NOTE — Progress Notes (Signed)
 BP 135/82 (BP Location: Left Arm, Patient Position: Sitting, Cuff Size: Normal)   Pulse 78   Temp 97.9 F (36.6 C) (Oral)   Ht 5' 6 (1.676 m)   Wt 164 lb 3.2 oz (74.5 kg)   SpO2 100%   BMI 26.50 kg/m    Subjective:    Patient ID: Bethany Malone, female    DOB: 01-Sep-1995, 28 y.o.   MRN: [id number]  HPI: Bethany Malone is a 28 y.o. female  Chief Complaint  Patient presents with   Sinusitis   UPPER RESPIRATORY TRACT INFECTION Duration: about 3-4 days Worst symptom: congestion, bloody nose Fever: no Cough: no Shortness of breath: no Wheezing: no Chest pain: no Chest tightness: no Chest congestion: no Nasal congestion: yes Runny nose: yes Post nasal drip: yes Sneezing: yes Sore throat: no Swollen glands: no Sinus pressure: yes Headache: yes Face pain: yes Toothache: no Ear pain: no  Ear pressure: no  Eyes red/itching:no Eye drainage/crusting: no  Vomiting: no Rash: no Fatigue: yes Sick contacts: no Strep contacts: no  Context: fluctuating Recurrent sinusitis: no Relief with OTC cold/cough medications: no  Treatments attempted: augmentin    Relevant past medical, surgical, family and social history reviewed and updated as indicated. Interim medical history since our last visit reviewed. Allergies and medications reviewed and updated.  Review of Systems  Constitutional: Negative.   HENT:  Positive for congestion, nosebleeds, postnasal drip, rhinorrhea, sinus pressure and sinus pain. Negative for dental problem, drooling, ear discharge, ear pain, facial swelling, hearing loss, mouth sores, sneezing, sore throat, tinnitus, trouble swallowing and voice change.   Respiratory: Negative.    Cardiovascular: Negative.   Musculoskeletal: Negative.   Psychiatric/Behavioral: Negative.      Per HPI unless specifically indicated above     Objective:    BP 135/82 (BP Location: Left Arm, Patient Position: Sitting, Cuff Size: Normal)   Pulse 78   Temp 97.9 F  (36.6 C) (Oral)   Ht 5' 6 (1.676 m)   Wt 164 lb 3.2 oz (74.5 kg)   SpO2 100%   BMI 26.50 kg/m   Wt Readings from Last 3 Encounters:  09/21/23 164 lb 3.2 oz (74.5 kg)  09/09/23 163 lb (73.9 kg)  08/18/23 163 lb (73.9 kg)    Physical Exam Vitals and nursing note reviewed.  Constitutional:      General: She is not in acute distress.    Appearance: Normal appearance. She is not ill-appearing, toxic-appearing or diaphoretic.  HENT:     Head: Normocephalic and atraumatic.     Right Ear: Hearing, ear canal and external ear normal. Tympanic membrane is erythematous and bulging.     Left Ear: Tympanic membrane, ear canal and external ear normal.     Nose: Congestion and rhinorrhea present.     Mouth/Throat:     Mouth: Mucous membranes are moist.     Pharynx: Oropharynx is clear. No oropharyngeal exudate or posterior oropharyngeal erythema.  Eyes:     General: No scleral icterus.       Right eye: No discharge.        Left eye: No discharge.     Extraocular Movements: Extraocular movements intact.     Conjunctiva/sclera: Conjunctivae normal.     Pupils: Pupils are equal, round, and reactive to light.  Cardiovascular:     Rate and Rhythm: Normal rate and regular rhythm.     Pulses: Normal pulses.     Heart sounds: Normal heart sounds. No murmur heard.  No friction rub. No gallop.  Pulmonary:     Effort: Pulmonary effort is normal. No respiratory distress.     Breath sounds: Normal breath sounds. No stridor. No wheezing, rhonchi or rales.  Chest:     Chest wall: No tenderness.  Musculoskeletal:        General: Normal range of motion.     Cervical back: Normal range of motion and neck supple.  Skin:    General: Skin is warm and dry.     Capillary Refill: Capillary refill takes less than 2 seconds.     Coloration: Skin is not jaundiced or pale.     Findings: No bruising, erythema, lesion or rash.  Neurological:     General: No focal deficit present.     Mental Status: She is  alert and oriented to person, place, and time. Mental status is at baseline.  Psychiatric:        Mood and Affect: Mood normal.        Behavior: Behavior normal.        Thought Content: Thought content normal.        Judgment: Judgment normal.     Results for orders placed or performed during the hospital encounter of 09/02/23  POC SARS Coronavirus 2 Ag   Collection Time: 09/02/23  1:45 PM  Result Value Ref Range   SARS Coronavirus 2 Ag Negative Negative      Assessment & Plan:   Problem List Items Addressed This Visit   None Visit Diagnoses       Acute recurrent maxillary sinusitis    -  Primary   Recurrent. Will treat with levaquin . Call if not getting better or getting worse. Continue to monitor.   Relevant Medications   levofloxacin  (LEVAQUIN ) 750 MG tablet        Follow up plan: Return if symptoms worsen or fail to improve.

## 2023-09-22 ENCOUNTER — Ambulatory Visit: Admitting: Family Medicine

## 2023-11-15 ENCOUNTER — Ambulatory Visit: Admitting: Pediatrics

## 2023-11-16 ENCOUNTER — Encounter: Payer: Self-pay | Admitting: Pediatrics

## 2023-11-16 ENCOUNTER — Ambulatory Visit: Admitting: Pediatrics

## 2023-11-16 VITALS — BP 113/77 | HR 84 | Ht 66.0 in | Wt 197.0 lb

## 2023-11-16 DIAGNOSIS — N644 Mastodynia: Secondary | ICD-10-CM | POA: Diagnosis not present

## 2023-11-16 NOTE — Progress Notes (Signed)
 Office Visit  BP 113/77   Pulse 84   Ht 5' 6 (1.676 m)   Wt 197 lb (89.4 kg)   BMI 31.80 kg/m    Subjective:    Patient ID: Bethany Malone, female    DOB: 1995/08/01, 28 y.o.   MRN: [id number]  HPI: Bethany Malone is a 28 y.o. female  Chief Complaint  Patient presents with   Breast Pain    Left breast. Onset about 3 weeks ago. Tender to touch. Pain comes and goes. Denies lumps. Hx of breast cancer in family     Discussed the use of AI scribe software for clinical note transcription with the patient, who gave verbal consent to proceed.  History of Present Illness   Bethany Malone is a 28 year old female who presents with breast tenderness.  She has been experiencing tenderness in the lower section of her breast for the past three weeks. The tenderness is more noticeable when wearing a sports bra or when the area is compressed. The intensity of the pain varies, with some days being worse than others, but today the pain is not severe.  No lumps, bumps, discharge, dimples, or color changes in the breast area. She has not performed a thorough self-examination due to fear.  There is a family history of breast cancer, which is relevant to her current concerns.  She reports a change in breast size, which she attributes to a weight gain of approximately fifty pounds over the past year.        Relevant past medical, surgical, family and social history reviewed and updated as indicated. Interim medical history since our last visit reviewed. Allergies and medications reviewed and updated.  ROS per HPI unless specifically indicated above     Objective:    BP 113/77   Pulse 84   Ht 5' 6 (1.676 m)   Wt 197 lb (89.4 kg)   BMI 31.80 kg/m   Wt Readings from Last 3 Encounters:  11/16/23 197 lb (89.4 kg)  09/21/23 164 lb 3.2 oz (74.5 kg)  09/09/23 163 lb (73.9 kg)     Physical Exam Constitutional:      Appearance: Normal appearance.  Pulmonary:     Effort: Pulmonary  effort is normal.  Musculoskeletal:        General: Normal range of motion.  Skin:    Comments: Normal skin color  Neurological:     General: No focal deficit present.     Mental Status: She is alert. Mental status is at baseline.  Psychiatric:        Mood and Affect: Mood normal.        Behavior: Behavior normal.        Thought Content: Thought content normal.         05/18/2023    1:15 PM 02/07/2023   10:06 AM 01/31/2023    1:13 PM 05/21/2022   10:44 AM 03/23/2022    1:23 PM  Depression screen PHQ 2/9  Decreased Interest 0 1 1 0 0  Down, Depressed, Hopeless 0 1 1 0 0  PHQ - 2 Score 0 2 2 0 0  Altered sleeping 1 1 1  0 0  Tired, decreased energy 1 1 1  0 0  Change in appetite 1 0 0 0 0  Feeling bad or failure about yourself  1 1 1  0 0  Trouble concentrating 0 1 1 0 0  Moving slowly or fidgety/restless 0 0 0 0 0  Suicidal thoughts 0  0 0 0 0  PHQ-9 Score 4 6 6  0 0  Difficult doing work/chores  Somewhat difficult  Not difficult at all Not difficult at all       05/18/2023    1:17 PM 02/07/2023   10:07 AM 01/31/2023    1:13 PM 05/21/2022   10:44 AM  GAD 7 : Generalized Anxiety Score  Nervous, Anxious, on Edge 2 2 3  0  Control/stop worrying 2 3 3  0  Worry too much - different things 2 3 3  0  Trouble relaxing 1 2 3  0  Restless 1 1 2  0  Easily annoyed or irritable 2 3 3  0  Afraid - awful might happen 2 2 3  0  Total GAD 7 Score 12 16 20  0  Anxiety Difficulty Very difficult Very difficult Very difficult Not difficult at all       Assessment & Plan:  Assessment & Plan   Breast tenderness in female Intermittent tenderness without alarming symptoms. Possible nerve compression due to bra type or weight changes. Family history of breast cancer noted. - Order breast ultrasound. - Advise awaiting ultrasound results before follow-up. - Discussed potential nerve compression due to bra type or weight changes.  -     US  LIMITED ULTRASOUND INCLUDING AXILLA LEFT BREAST ; Future  Follow  up plan: Return if symptoms worsen or fail to improve.  Hadassah SHAUNNA Nett, MD    Approximately 30 minutes spent on patient encounter today including assessment, counseling, diagnosing, treatment plan development, and charting.

## 2023-11-29 ENCOUNTER — Ambulatory Visit
Admission: RE | Admit: 2023-11-29 | Discharge: 2023-11-29 | Disposition: A | Discharge status: Home / Self Care | Source: Ambulatory Visit | Attending: Pediatrics | Admitting: Pediatrics

## 2023-11-29 DIAGNOSIS — N644 Mastodynia: Secondary | ICD-10-CM | POA: Insufficient documentation

## 2023-11-30 ENCOUNTER — Ambulatory Visit: Payer: Self-pay | Admitting: Pediatrics

## 2024-01-18 ENCOUNTER — Ambulatory Visit
Admission: EM | Admit: 2024-01-18 | Discharge: 2024-01-18 | Disposition: A | Discharge status: Home / Self Care | Source: Home / Self Care

## 2024-01-18 DIAGNOSIS — J0100 Acute maxillary sinusitis, unspecified: Secondary | ICD-10-CM | POA: Diagnosis not present

## 2024-01-18 MED ORDER — AMOXICILLIN-POT CLAVULANATE 875-125 MG PO TABS: 1.0000 | ORAL_TABLET | Freq: Two times a day (BID) | ORAL | 0 refills | Status: DC

## 2024-01-18 NOTE — Discharge Instructions (Addendum)
 Start Augmentin twice daily for 7 days.  Nasal rinses such as Merrilyn Sieve as you tolerate.  Lots of rest and fluids.  Please follow-up with your PCP if your symptoms do not improve.  Please go to the emergency room for any worsening symptoms.  Hope you feel better soon!

## 2024-01-18 NOTE — ED Triage Notes (Signed)
 Pt c/o sinus pressure & pain x1 wk. Had fever on Monday. Tmax 100.8. Has tried OTC meds w/o relief.

## 2024-01-18 NOTE — ED Provider Notes (Signed)
 " [facility]    CSN: [id number] Arrival date & time: 01/18/24  1447      History   Chief Complaint Chief Complaint  Patient presents with   Sinus Problem    HPI Ethelyne Erich is a 28 y.o. female  presents for evaluation of URI symptoms for 7 days. Patient reports associated symptoms of sinus pressure/pain with postnasal drip/cough and fever. Denies N/V/D, sore throat, ear pain, body aches, shortness of breath. Patient does not have a hx of asthma. Patient is an active smoker.   Reports sick contacts via fianc.  Pt has taken cold medicine OTC for symptoms.  Denies pregnancy or breast-feeding.  Pt has no other concerns at this time.    Sinus Problem    Past Medical History:  Diagnosis Date   Allergy    Anxiety    Depression    GERD (gastroesophageal reflux disease)     Patient Active Problem List   Diagnosis Date Noted   Chronic migraine with aura without status migrainosus, not intractable 05/18/2023   Miscarriage 03/29/2023   Missed abortion 03/29/2023   Encounter for procreative genetic counseling 03/25/2023   Immunization counseling 03/16/2023   Normal pregnancy in first trimester 03/16/2023   Chronic cluster headache 03/16/2023   Panic attacks 01/31/2023   TMJ tenderness, right 08/17/2021   Marijuana smoker 11/24/2015   Depression with anxiety 05/15/2015    Past Surgical History:  Procedure Laterality Date   PLANTAR FASCIA SURGERY Right 2014    OB History     Gravida  1   Para  0   Term  0   Preterm  0   AB  1   Living         SAB  1   IAB  0   Ectopic  0   Multiple      Live Births               Home Medications    Prior to Admission medications  Medication Sig Start Date End Date Taking? Authorizing Provider  amoxicillin -clavulanate (AUGMENTIN) 875-125 MG tablet Take 1 tablet by mouth every 12 (twelve) hours. 01/18/24  Yes Prynce Jacober, Jodi R, NP  azelastine  (ASTELIN ) 0.1 % nasal spray Place 2 sprays into both  nostrils 2 (two) times daily. Use in each nostril as directed Patient not taking: Reported on 11/16/2023 09/02/23   Iola Lukes, FNP  Rimegepant Sulfate (NURTEC) 75 MG TBDP Take 1 tablet (75 mg total) by mouth daily as needed. 06/27/23   Vicci Duwaine SQUIBB, DO    Family History Family History  Problem Relation Age of Onset   Anxiety disorder Mother    Rheum arthritis Mother    Fibromyalgia Mother    Anxiety disorder Father    Anxiety disorder Sister    Cancer Maternal Grandfather    Cancer Paternal Grandmother    Anxiety disorder Paternal Grandmother    Stroke Paternal Grandfather    Cancer Paternal Grandfather    Cancer Maternal Aunt    Cancer Maternal Aunt     Social History Social History[1]   Allergies   Fluticasone   Review of Systems Review of Systems  HENT:  Positive for congestion, sinus pressure and sinus pain.   Respiratory:  Positive for cough.      Physical Exam Triage Vital Signs ED Triage Vitals  Encounter Vitals Group     BP 01/18/24 1502 138/88     Girls Systolic BP Percentile --  Girls Diastolic BP Percentile --      Boys Systolic BP Percentile --      Boys Diastolic BP Percentile --      Pulse Rate 01/18/24 1502 90     Resp 01/18/24 1502 16     Temp 01/18/24 1502 98.4 F (36.9 C)     Temp Source 01/18/24 1502 Oral     SpO2 01/18/24 1502 98 %     Weight 01/18/24 1500 193 lb 12.8 oz (87.9 kg)     Height --      Head Circumference --      Peak Flow --      Pain Score 01/18/24 1501 7     Pain Loc --      Pain Education --      Exclude from Growth Chart --    No data found.  Updated Vital Signs BP 138/88 (BP Location: Right Arm)   Pulse 90   Temp 98.4 F (36.9 C) (Oral)   Resp 16   Wt 193 lb 12.8 oz (87.9 kg)   LMP 11/17/2023 (Approximate)   SpO2 98%   BMI 31.28 kg/m   Visual Acuity Right Eye Distance:   Left Eye Distance:   Bilateral Distance:    Right Eye Near:   Left Eye Near:    Bilateral Near:     Physical  Exam Vitals and nursing note reviewed.  Constitutional:      General: She is not in acute distress.    Appearance: She is well-developed. She is not ill-appearing.  HENT:     Head: Normocephalic and atraumatic.     Right Ear: Tympanic membrane and ear canal normal.     Left Ear: Tympanic membrane and ear canal normal.     Nose: Congestion present.     Right Turbinates: Swollen and pale.     Left Turbinates: Swollen and pale.     Right Sinus: Maxillary sinus tenderness present. No frontal sinus tenderness.     Left Sinus: Maxillary sinus tenderness present. No frontal sinus tenderness.     Mouth/Throat:     Mouth: Mucous membranes are moist.     Pharynx: Oropharynx is clear. Uvula midline. No oropharyngeal exudate or posterior oropharyngeal erythema.     Tonsils: No tonsillar exudate or tonsillar abscesses.  Eyes:     Conjunctiva/sclera: Conjunctivae normal.     Pupils: Pupils are equal, round, and reactive to light.  Cardiovascular:     Rate and Rhythm: Normal rate and regular rhythm.     Heart sounds: Normal heart sounds.  Pulmonary:     Effort: Pulmonary effort is normal.     Breath sounds: Normal breath sounds.  Musculoskeletal:     Cervical back: Normal range of motion and neck supple.  Lymphadenopathy:     Cervical: No cervical adenopathy.  Skin:    General: Skin is warm and dry.  Neurological:     General: No focal deficit present.     Mental Status: She is alert and oriented to person, place, and time.  Psychiatric:        Mood and Affect: Mood normal.        Behavior: Behavior normal.      UC Treatments / Results  Labs (all labs ordered are listed, but only abnormal results are displayed) Labs Reviewed - No data to display  EKG   Radiology No results found.  Procedures Procedures (including critical care time)  Medications Ordered in UC Medications - No data  to display  Initial Impression / Assessment and Plan / UC Course  I have reviewed the  triage vital signs and the nursing notes.  Pertinent labs & imaging results that were available during my care of the patient were reviewed by me and considered in my medical decision making (see chart for details).     Reviewed exam and symptoms with patient.  No red flags.  Will start Augmentin for sinusitis.  Patient does not tolerate Flonase.  Nasal rinses as tolerated.  PCP follow-up if symptoms do not improve.  ER precautions reviewed. Final Clinical Impressions(s) / UC Diagnoses   Final diagnoses:  Acute maxillary sinusitis, recurrence not specified     Discharge Instructions      Start Augmentin twice daily for 7 days.  Nasal rinses such as Merrilyn Sieve as you tolerate.  Lots of rest and fluids.  Please follow-up with your PCP if your symptoms do not improve.  Please go to the emergency room for any worsening symptoms.  Hope you feel better soon!    ED Prescriptions     Medication Sig Dispense Auth. Provider   amoxicillin -clavulanate (AUGMENTIN) 875-125 MG tablet Take 1 tablet by mouth every 12 (twelve) hours. 14 tablet Dawnelle Warman, Jodi R, NP      PDMP not reviewed this encounter.    [1]  Social History Tobacco Use   Smoking status: Former    Current packs/day: 0.25    Types: Cigarettes    Passive exposure: Current   Smokeless tobacco: Never   Tobacco comments:    She has desire to quit and has not yet committed to quitting  Vaping Use   Vaping status: Former  Substance Use Topics   Alcohol use: Yes    Comment: on occasion   Drug use: Not Currently    Types: Marijuana     Loreda Myla SAUNDERS, NP 01/18/24 1521  "

## 2024-01-25 ENCOUNTER — Ambulatory Visit: Payer: Self-pay

## 2024-01-25 ENCOUNTER — Ambulatory Visit

## 2024-01-25 VITALS — BP 107/68 | HR 160 | Temp 98.2°F | Resp 15 | Ht 65.98 in | Wt 192.8 lb

## 2024-01-25 DIAGNOSIS — Z3201 Encounter for pregnancy test, result positive: Secondary | ICD-10-CM

## 2024-01-25 LAB — PREGNANCY, URINE: Preg Test, Ur: POSITIVE — AB

## 2024-01-25 NOTE — Progress Notes (Signed)
 "  LMP 12/18/2023 (Approximate)    Subjective:    Patient ID: Bethany Malone, female    DOB: 02/12/1995, 29 y.o.   MRN: [id number]  HPI: Bethany Malone is a 29 y.o. female seeking pregnancy test urine, LMP 12/17/23.  Previous miscarriage -G2P0; denies symptoms; no vaginal bleeding or leaking, no nausea.  Chief Complaint  Patient presents with   Confimation of Pregnancy    Saturday had 2 positive at home pregnancy.     Relevant past medical, surgical, family and social history reviewed and updated as indicated. Interim medical history since our last visit reviewed. Allergies and medications reviewed and updated.  Review of Systems  Constitutional:  Negative for appetite change, fatigue and fever.  HENT:  Negative for hearing loss, sinus pressure and sinus pain.   Respiratory:  Negative for chest tightness and shortness of breath.   Cardiovascular:  Negative for chest pain and palpitations.  Gastrointestinal:  Negative for abdominal pain and nausea.  Endocrine: Negative for cold intolerance and heat intolerance.  Genitourinary:  Negative for difficulty urinating, pelvic pain, vaginal bleeding, vaginal discharge and vaginal pain.  Musculoskeletal:  Negative for back pain.  Skin:  Negative for rash.  Allergic/Immunologic: Negative for immunocompromised state.  Neurological:  Negative for dizziness, light-headedness, numbness and headaches.  Hematological:  Negative for adenopathy.  Psychiatric/Behavioral:  Negative for agitation, behavioral problems and dysphoric mood.     Per HPI unless specifically indicated above     Objective:    LMP 12/18/2023 (Approximate)   Wt Readings from Last 3 Encounters:  01/18/24 193 lb 12.8 oz (87.9 kg)  11/16/23 197 lb (89.4 kg)  09/21/23 164 lb 3.2 oz (74.5 kg)    Physical Exam HENT:     Head: Normocephalic and atraumatic.     Right Ear: Tympanic membrane normal.     Left Ear: Tympanic membrane normal.     Nose: Nose normal.  Neck:      Vascular: No carotid bruit.  Cardiovascular:     Rate and Rhythm: Regular rhythm.  Pulmonary:     Effort: Pulmonary effort is normal.     Breath sounds: Normal breath sounds. No wheezing or rhonchi.  Musculoskeletal:        General: Normal range of motion.     Cervical back: Normal range of motion and neck supple. No rigidity or tenderness.  Lymphadenopathy:     Cervical: No cervical adenopathy.  Skin:    General: Skin is warm and dry.  Neurological:     General: No focal deficit present.     Mental Status: She is oriented to person, place, and time. Mental status is at baseline.  Psychiatric:        Mood and Affect: Mood normal.        Behavior: Behavior normal.        Thought Content: Thought content normal.        Judgment: Judgment normal.     Results for orders placed or performed during the hospital encounter of 09/02/23  POC SARS Coronavirus 2 Ag   Collection Time: 09/02/23  1:45 PM  Result Value Ref Range   SARS Coronavirus 2 Ag Negative Negative      Assessment & Plan:   Assessment & Plan Positive urine pregnancy test 2x positive urine hcg test at home this weekend, LMP 12/17/23; seeking midwife care for current pregnancy.  Will notify via mychart with pregnancy results.  Referral to [facility] and CNM office. Orders:   Ambulatory referral  to Obstetrics / Gynecology   Pregnancy, urine   Follow up plan: No follow-ups on file.      "

## 2024-02-06 ENCOUNTER — Ambulatory Visit

## 2024-02-06 VITALS — Wt 195.8 lb

## 2024-02-06 DIAGNOSIS — Z3480 Encounter for supervision of other normal pregnancy, unspecified trimester: Secondary | ICD-10-CM | POA: Insufficient documentation

## 2024-02-06 DIAGNOSIS — Z3689 Encounter for other specified antenatal screening: Secondary | ICD-10-CM

## 2024-02-06 DIAGNOSIS — O3680X0 Pregnancy with inconclusive fetal viability, not applicable or unspecified: Secondary | ICD-10-CM

## 2024-02-06 NOTE — Progress Notes (Signed)
 New OB Intake  I connected with  Bethany Malone on 02/06/24 at  1:15 PM EST in person. Nurse and patient are located at [facility].  I explained I am completing New OB Intake today. We discussed her EDD of 09/22/24 that is based on LMP of 12/17/23. Pt is G2/P0010. I reviewed her allergies, medications, Medical/Surgical/OB history, and appropriate screenings. There are no cats in the home. Based on history, this is a/an pregnancy uncomplicated . Her obstetrical history is significant for MABG1 Monosomy X 02/2023.  Patient Active Problem List   Diagnosis Date Noted   Supervision of other normal pregnancy, antepartum 02/06/2024   Chronic migraine with aura without status migrainosus, not intractable 05/18/2023   Chronic cluster headache 03/16/2023   Panic attacks 01/31/2023   Depression with anxiety 05/15/2015    Concerns addressed today: None  Delivery Plans:  Plans to deliver at [facility]  Anatomy US  Explained first scheduled US  will be 02/27/24. Anatomy US  will be scheduled around [redacted] weeks gestational age.  Labs Discussed genetic screening with patient. Patient desires genetic testing to be drawn at new OB visit. Discussed possible labs to be drawn at new OB appointment.  COVID Vaccine Patient has had COVID vaccine.   Social Determinants of Health Food Insecurity: denies food insecurity WIC Referral: Patient is not interested in referral to [facility].  Transportation: Patient denies transportation needs. Childcare: Discussed no children allowed at ultrasound appointments.   First visit review I reviewed new OB appt with pt. I explained she will have blood work and pap smear/pelvic exam if indicated. Explained pt will be seen by Lauraine Lakes, CNM at first visit; encounter routed to appropriate provider.   [place], LPN 8/80/7973  7:99 PM

## 2024-02-06 NOTE — Patient Instructions (Signed)
 First Trimester of Pregnancy  The first trimester of pregnancy starts on the first day of your last monthly period until the end of week 13. This is months 1 through 3 of pregnancy. A week after a sperm fertilizes an egg, the egg will implant into the wall of the uterus and begin to develop into a baby. Body changes during your first trimester Your body goes through many changes during pregnancy. The changes usually return to normal after your baby is born. Physical changes Your breasts may grow larger and may hurt. The area around your nipples may get darker. Your periods will stop. Your hair and nails may grow faster. You may pee more often. Health changes You may tire easily. Your gums may bleed and may be sensitive when you brush and floss. You may not feel hungry. You may have heartburn. You may throw up or feel like you may throw up. You may want to eat some foods, but not others. You may have headaches. You may have trouble pooping (constipation). Other changes Your emotions may change from day to day. You may have more dreams. Follow these instructions at home: Medicines Talk to your health care provider if you're taking medicines. Ask if the medicines are safe to take during pregnancy. Your provider may change the medicines that you take. Do not take any medicines unless told to by your provider. Take a prenatal vitamin that has at least 600 micrograms (mcg) of folic acid. Do not use herbal medicines, illegal substances, or medicines that are not approved by your provider. Eating and drinking While you're pregnant your body needs extra food for your growing baby. Talk with your provider about what to eat while pregnant. Activity Most women are able to exercise during pregnancy. Exercises may need to change as your pregnancy goes on. Talk to your provider about your activities and exercise routines. Relieving pain and discomfort Wear a good, supportive bra if your breasts  hurt. Rest with your legs raised if you have leg cramps or low back pain. Safety Wear your seatbelt at all times when you're in a car. Talk to your provider if someone hits you, hurts you, or yells at you. Talk with your provider if you're feeling sad or have thoughts of hurting yourself. Lifestyle Certain things can be harmful while you're pregnant. Follow these rules: Do not use hot tubs, steam rooms, or saunas. Do not douche. Do not use tampons or scented pads. Do not drink alcohol,smoke, vape, or use products with nicotine or tobacco in them. If you need help quitting, talk with your provider. Avoid cat litter boxes and soil used by cats. These things carry germs that can cause harm to your pregnancy and your baby. General instructions Keep all follow-up visits. It helps you and your unborn baby stay as healthy as possible. Write down your questions. Take them to your visits. Your provider will: Talk with you about your overall health. Give you advice or refer you to specialists who can help with different needs, including: Prenatal education classes. Mental health and counseling. Foods and healthy eating. Ask for help if you need help with food. Call your dentist and ask to be seen. Brush your teeth with a soft toothbrush. Floss gently. Where to find more information American Pregnancy Association: americanpregnancy.org [employer]: acog.org Office on [employer] Health: [place] Contact a health care provider if: You feel dizzy, faint, or have a fever. You vomit or have watery poop (diarrhea) for 2  days or more. You have abnormal discharge or bleeding from your vagina. You have pain when you pee or your pee smells bad. You have cramps, pain, or pressure in your belly area. Get help right away if: You have trouble breathing or chest pain. You have any kind of injury, such as from a fall or a car crash. These symptoms may be an  emergency. Get help right away. Call 911. Do not wait to see if the symptoms will go away. Do not drive yourself to the hospital. This information is not intended to replace advice given to you by your health care provider. Make sure you discuss any questions you have with your health care provider. Document Revised: 10/07/2022 Document Reviewed: 05/07/2022 Elsevier Patient Education  2024 [employer] Severe Morning Sickness: What to Know Severe morning sickness is serious and can happen during pregnancy. You may hear it called hyperemesis gravidarum or HG. In severe morning sickness, you may vomit all day or for many days. You may also feel like vomiting for many days. Severe morning sickness can keep you from eating and drinking enough. This may make you lose too much fluid in your body, or become dehydrated. It can also lead to poor nutrition and weight loss. Severe morning sickness usually happens within the first 20 weeks of pregnancy. After that, it may go away for the rest of your pregnancy, but sometimes it doesn't. What are the causes? The cause of severe morning sickness is not known. It may be linked to: Changes in chemicals called hormones. Changes in the digestive system, such as the esophagus, the stomach, and the bowels. Conditions that are passed in families. What are the signs or symptoms? Very bad vomiting or feeling like you may vomit. These do not get better or go away. Loss of body fluids. No desire for food. Weight loss. How is this diagnosed? Severe morning sickness may be diagnosed based on your medical history, your symptoms, and an exam. Tests that may be done include: Blood tests. Urine tests. Checking blood pressure. How is this treated? You may be told to: Follow an eating plan to lessen vomiting. Eat or drink foods or fluids that have ginger, ginger ale, or ginger tea in them. Use acupressure bracelets or hypnosis. Take medicines. An eating plan and  medicines may be used together. If medicines don't help, you may need to get fluids through an IV. This is a small catheter that's put into a vein in your hand or arm. Follow these instructions at home: To help ease your symptoms, listen to your body. Everyone is different. Find what works best for you. Here are some things you can try to help relieve your symptoms: Meals and snacks  Eat 5-6 small meals or snacks instead of 3 large meals a day. Eat slowly. Before getting out of bed, eat a few crackers. Eat a snack with protein before bed. This could be cheese and crackers, or a peanut butter sandwich made with 1 slice of whole-wheat bread and 1 tsp (5 g) of peanut butter. Try to eat starchy foods as these are usually tolerated well. Examples include toast, bread, pasta, rice, and pretzels. Eat at least one serving of protein with your meals and snacks. Proteins include lean meats, poultry, seafood, beans, nuts, nut butters, eggs, cheese, and yogurt. Fluids It's important to have enough fluid in your body. Try to: Drink small amounts of fluids often. Drink fluids 30 minutes before or after a meal. This will help  lessen the feeling of a full stomach. Drink 100% fruit juice or an electrolyte drink. An electrolyte drink contains sodium, potassium, and chloride. Drink fluids that are cold, clear, and carbonated or sour. These include lemonade, ginger ale, lemon-lime soda, ice water, and sparkling water. Add  tsp (0.44 g) ground ginger to hot tea, or drink ginger tea. Other tips that can help Try to avoid: Eating foods that trigger your symptoms. These may include spicy foods, coffee, high-fat foods, and acidic foods. Drinking more than 1 cup of fluid at a time. Skipping meals. The feeling that you may vomit can be worse when your stomach is empty. But if you cannot tolerate food, don't force it. Try sucking on ice chips or other frozen liquids and make up for missed meals later. Do not lie down  for at least 2 hours after eating. Stay away from things that may make you vomit, such as: Food smells. Smoke. Strong smells. Loud noises. Warm, humid areas. Fast movements. General instructions Take your medicines only as told. Continue to take your prenatal vitamins as told. If you're having trouble taking them, talk with your provider about other choices. Talk with your provider about taking vitamin B6. Brush your teeth or use a mouth rinse after meals. Keep all follow-up visits. Your provider will check on your health and symptoms, as well as your baby's health. Contact a health care provider if: You have pain in your belly. You have a very bad headache. You can't see properly. You feel weak or dizzy. You can't eat or drink without throwing up, especially if this goes on for a full day. You throw up or feel like you may throw up, and the symptoms do not go away. You're losing weight. Get help right away if: There's blood in your vomit. You are very weak or faint. You have a fever and your symptoms suddenly get worse. You feel like your heart is racing or skipping a beat (palpitations). These symptoms may be an emergency. Get help right away. If you can't reach your provider, go to an urgent care or emergency room, or call 911. Do not wait to see if the symptoms will go away. Do not drive yourself to the hospital. This information is not intended to replace advice given to you by your health care provider. Make sure you discuss any questions you have with your health care provider. Document Revised: 06/30/2022 Document Reviewed: 06/30/2022 Elsevier Patient Education  2024 [employer]. Genetic Testing During Pregnancy: What to Know Genetic testing is done when you're pregnant to check if your baby might have a congenital condition. A congenital condition is something a baby is born with, also called a birth condition. These conditions can happen when genes or chromosomes are not  normal. Genes are tiny parts in your body that make up chromosomes. Chromosomes are groups of many genes. Together, they tell your body how to look and work. Genes are passed down from parents to their baby. Why is genetic testing done during pregnancy? Genetic testing allows you to: Talk about your test results and future plans with your health care team. Plan for a baby that may be born with a congenital condition. Make plans with your health care team in case your baby needs special care before or after birth. Think about your options regarding whether you want to continue with the pregnancy. Types of genetic tests A genetic test can be a screening test or a diagnostic test. Screening tests  Screening tests are used to check the risk of your baby having a congenital condition. They don't show if your baby actually has the condition. More testing will be needed to know for sure. Screening tests are recommended for all pregnant people. Screening tests will not hurt your baby. Types of screening tests include: Carrier screening. The parents' blood or saliva is tested to check for genes that aren't normal. These genes can be passed to the baby. If both parents have the gene, the baby is at risk. First-trimester screening. This includes a maternal blood test and an ultrasound of your baby. This test checks for a risk of conditions related to chromosomes. It also looks for problems with your baby's heart, belly, or bones. Second-trimester screening. This may include a maternal blood test and an ultrasound of your baby. This test checks for the risk of conditions related to chromosomes. It also looks for problems with many parts of your baby's body. These include the brain, nose, mouth, spine, heart, and arms or legs. Some people may only have an ultrasound and not have a blood test. Combined or sequential screening. This looks at the results from the blood tests in the first and second trimesters,  along with the findings of the first-trimester ultrasound. It helps to tell you more about your baby. This type of testing may be more accurate than just doing screening in the first or second trimester by itself. Cell-free DNA testing. During pregnancy, cells from your placenta get into your blood, which is normal. This test is a blood test that looks at those cells. It's done after 10 weeks of pregnancy. It can be used to check for the risk of conditions caused by having too many chromosomes or an abnormal number of sex chromosomes.  Diagnostic tests Diagnostic tests are done only if your baby is known to be at risk of having a congenital condition. These tests check the cells from your baby to diagnose a condition. Examples of these tests include: Chorionic villus sampling (CVS). This is a procedure where cells are taken from the placenta for testing. To do this, a needle is put into your belly using guided ultrasound. Amniocentesis. This is a procedure where amniotic fluid is removed from the sac around your baby. The cells from the placenta or amniotic fluid are tested for chromosomes that are not normal. What do the results mean? For a screening test: If your results are negative, it means that your baby is most likely not at a higher risk for a condition. There's still a small chance your baby could have a condition. If your results are positive, it means that your baby's risk for a condition is higher than normal. Your health care provider may want you to have a diagnostic test. For a diagnostic test: If the result is negative, it's not likely that your baby will have a condition. If the test is positive, your baby most likely has a condition. Talk with your provider about what your results mean and what your options are. Questions to ask your health care provider Talk with your provider about the conditions that run in your family. Ask these questions: Is my baby at risk for a congenital  condition? What are the benefits of having genetic screening? Should I meet with a genetic counselor? Should my partner or other members of my family be tested? What tests are best for me and my baby? How much do the tests cost? Will my insurance cover the testing?  What are the risks of each test? This information is not intended to replace advice given to you by your health care provider. Make sure you discuss any questions you have with your health care provider. Document Revised: 11/25/2022 Document Reviewed: 11/25/2022 Elsevier Patient Education  2025 [employer]. Tests and Screening During Pregnancy Tests and screenings during pregnancy are an important part of your prenatal care. These tests help your health care provider find any problems that might affect your pregnancy. Some tests need to be done for all pregnant people, and some are optional. Most of the tests and screenings do not pose any risks for you or your baby. You may need more testing if a test result shows there is a risk to your health or your baby's health. Tests and screenings done early in pregnancy Some tests and screenings you may have in early pregnancy are: Blood tests, such as: Complete blood count (CBC). Blood typing. Tests to check for diseases that can cause birth defects or can be passed to your baby, such as: German measles (rubella( and chicken pox. Hepatitis B and C. Human Immunodeficiency Virus (HIV). Syphilis. Zika virus. Pee tests. Blood pressure. Testing for sexually transmitted infections (STIs), such as chlamydia or gonorrhea. Testing for tuberculosis. Ultrasound. Tests and screenings done later in pregnancy Some common tests you can expect to have later in pregnancy include: Rh antibody testing. Pee and blood tests. Glucose screening. This checks your blood sugar. It will show whether you are developing the type of diabetes that happens during pregnancy, called gestational diabetes. You  may have this screening earlier if you have risk factors for diabetes. Ultrasound. This may be repeated at 16-20 weeks to check how your baby is growing. Screening for group B streptococcus (GBS). GBS is a type of bacteria that may live in your rectum or vagina. GBS can spread to your baby during birth. This test is done at 35-37 weeks of pregnancy. Non-stress test. This may be done more often if your pregnancy is high risk. Biophysical profile. This test includes ultrasound imaging and a non-stress test to check to see if your baby is healthy. This test may help decide when your baby should be born. Screening for birth defects Early in your pregnancy, tests can be done to find out if your baby is at risk for a genetic disorder. This testing is optional. The type of testing recommended for you will depend on your family and medical history, your ethnicity, and your age. Testing may include: Screening tests such as ultrasound, blood tests, or a combination of both. Carrier screening. If genetic screening shows that your baby is at risk for a genetic defect, diagnostic testing may be recommended, such as: Amniocentesis. Chorionic villus sampling. Unlike other tests done during pregnancy, diagnostic testing does have some risk for your pregnancy. Talk to your provider about the risks and benefits of genetic testing. Questions to ask your health care provider What tests are recommended for me? When and how will these tests be done? When will I get the results of the tests? What do the results of these tests mean for me or my baby? Do you recommend any genetic screening tests? Which ones? Should I see a genetic counselor before having genetic screening? Where to find more information Go to americanpregnancy.org Click on search. Type 'prenatal tests in the search box. Go to [place] Click on search. Type 'prenatal tests in the search box. Go to acog.org Click on search. Type routine  tests in  the search box. This information is not intended to replace advice given to you by your health care provider. Make sure you discuss any questions you have with your health care provider. Document Revised: 11/02/2022 Document Reviewed: 11/02/2022 Elsevier Patient Education  2025 [employer]. Questions to Ask Your Health Care Provider During Pregnancy  During pregnancy, you'll go through many changes. These will affect your body as well as your feelings and emotions. And you'll likely have a lot of questions. Your health care team is a good source for reliable answers. Make an appointment with your team if you're planning to get pregnant or as soon as you know that you're pregnant. New questions will come up as your pregnancy continues. Write your questions down and take them with you to your prenatal visits. Questions to ask about pregnancy Prenatal visits and tests How often will I have my prenatal visits? Should I visit the dentist during pregnancy? What type of screening tests should I consider? What type of routine tests are suggested and when are they done? What are the risks and benefits of these tests? When would you recommend an ultrasound, and what will it show? Is there a nurse line or office line I can call if I have questions? Caring for yourself during pregnancy How much weight should I gain? What kind of exercise should I get and how much? How much sleep should I get? What kinds of things can I do to help with stress and anxiety? Are there any travel restrictions? Is it OK to have sex? Eating and drinking during pregnancy What vitamins or supplements do you recommend? When should I start taking my prenatal vitamins? What's a healthy diet for me during pregnancy? What foods should I eat? What foods should I not eat? What if I'm on a special diet? Should I limit how much caffeine I have? Vaccinations What shots should I get? When is the best time to get  them? Are there shots I should not get? Medicine and substance use during pregnancy Are my current medicines OK to keep taking? Which medicines could hurt me or my baby? Which supplements or herbal medicines could hurt me or my baby? Why is it important not to smoke or drink alcohol? What about other drugs or substances? Where can I get help if I'm having a hard time quitting? Questions to ask about labor and birth Getting ready What are my birth options? Should I make a birth plan? Where can I have my baby? Is a birth center or home birth an option for me? What are the benefits of breastfeeding? When should I start preparing for breastfeeding? Classes Are there breastfeeding classes or support groups? Where can I find childbirth classes? Pain relief What is natural childbirth? What can I do to decrease pain during labor and birth? What are other methods to help with pain during labor and birth? Birth What is induced labor? What's an episiotomy, and when might I need one? How can I increase my chance for a vaginal birth? When would a C-section be advised? Can I have a vaginal birth if I had a C-section in the past? Other questions to ask How long will I need to stay in the hospital after giving birth? How soon can I get pregnant after giving birth? What are my birth control options? What are the signs of perinatal depression? What should I do if I have depression or anxiety that's getting worse? This information is not intended to replace advice  given to you by your health care provider. Make sure you discuss any questions you have with your health care provider. Document Revised: 09/28/2022 Document Reviewed: 09/28/2022 Elsevier Patient Education  2024 [employer]. How a Baby Grows During Pregnancy Pregnancy starts when a fertilized egg attaches to the lining of the uterus and begins to grow. It is a time of many changes in the mother's body. The changes happen: To support  your pregnancy. To help the baby grow. To prepare for the birth of your baby. How long does a pregnancy last? A pregnancy usually lasts 280 days, or about 40 weeks. Pregnancy is divided into three periods of growth, also called trimesters: First trimester: weeks 0-13. Second trimester: weeks 14-27. Third trimester: weeks 28-40. The end of the 40 weeks of pregnancy is your likely date of delivery, or due date. However, most babies are not born on their due date. How does my baby develop month by month?  First and second month The brain, spinal cord, and heart begin to develop. By 6 weeks, the heart begins to beat. The face, arms, and legs begin to form. Then the hands and feet begin to develop. All major organs begin to develop by the end of the second month. Third month All of the internal organs are forming. Bones and muscles are beginning to grow. The fetus is making movements similar to breathing. Fingernails and toenails are forming. Fourth month The skin is thin and transparent. The neck, outer ear, eyelids, and fingernails are formed. The external sex organs are formed. The fetus can hear, swallow, and move easily. The kidneys begin to make pee (urine). Fifth month The fetus moves around more and can be felt for the first time (quickening). The face, nose, and lips can be seen easily on ultrasound. The baby is covered with soft hair called lanugo. The organs in the digestive system work. Sixth month The lungs continue to grow and mature. The eyes open. The brain continues to develop. The fetus may begin to suck a finger. Skin ridges are formed that will be fingerprints and toe prints. Hair grows thicker and eyebrows can be seen. Seventh month Lungs are fully developed but not yet ready for birth. Eyes are developed enough to sense changes in light. The fetus responds to sound. The fetus kicks and stretches. Hands can make a grasping motion. Vernix, a waxy coating, is  starting to develop to protect the skin. Eighth month Most organs and body systems are fully developed and working. Bones harden, and taste buds develop. The fetus may hiccup. The brain is still developing. The skull remains soft. By week 31, most development is complete and the fetus is gaining weight fast. By the end of week 32, the fetus weighs a little more than 4 pounds (1.8 kg). Ninth month until your due date The lungs are fully developed and ready for birth. Patterns of sleep develop. The fetus weighs around 6 pounds at the beginning of the ninth month and may weigh around 8 pounds by your due date. The fetus's head typically moves into a head-down position in the uterus. Closer to your due date, the fetus's head may drop lower in your hips. How do I know if my baby is developing well? Always talk with your health care provider about any concerns that you may have about your pregnancy and your baby. At prenatal visits, your provider will do tests to check on your health and keep track of your baby's growth. These include:  Fundal height and position. To do this, your provider will: Measure your growing belly from your pubic bone to the top of the uterus using a tape measure. Feel your belly to determine your baby's position. Heartbeat. An ultrasound in the first trimester can confirm pregnancy and show a heartbeat, depending on how far along you are. Your provider will check your baby's heart rate at prenatal visits. You may also have a second trimester ultrasound to check your baby's development. Follow these instructions at home: Take your medicines as told. Take prenatal vitamins as told by your provider. These include vitamins such as folic acid, iron, calcium, and vitamin D . They are important for healthy development of your baby. Keep all follow-up visits. These include prenatal care and screening tests to check your health and your baby's health. This information is not  intended to replace advice given to you by your health care provider. Make sure you discuss any questions you have with your health care provider. Document Revised: 06/07/2022 Document Reviewed: 06/07/2022 [employer] Patient Education  2024 [employer].

## 2024-02-27 ENCOUNTER — Other Ambulatory Visit

## 2024-03-19 ENCOUNTER — Encounter: Admitting: Registered Nurse
